# Patient Record
Sex: Female | Born: 1985 | Race: White | Hispanic: No | Marital: Married | Smoking: Current every day smoker
Health system: Southern US, Community
[De-identification: ages and names within clinical notes are randomized; demographics above are authoritative.]

## PROBLEM LIST (undated history)

## (undated) DIAGNOSIS — M792 Neuralgia and neuritis, unspecified: Secondary | ICD-10-CM

## (undated) DIAGNOSIS — E282 Polycystic ovarian syndrome: Secondary | ICD-10-CM

## (undated) DIAGNOSIS — R350 Frequency of micturition: Secondary | ICD-10-CM

## (undated) DIAGNOSIS — Z973 Presence of spectacles and contact lenses: Secondary | ICD-10-CM

## (undated) DIAGNOSIS — Z87442 Personal history of urinary calculi: Secondary | ICD-10-CM

## (undated) DIAGNOSIS — G4089 Other seizures: Secondary | ICD-10-CM

## (undated) DIAGNOSIS — Z8619 Personal history of other infectious and parasitic diseases: Secondary | ICD-10-CM

## (undated) DIAGNOSIS — E039 Hypothyroidism, unspecified: Secondary | ICD-10-CM

## (undated) DIAGNOSIS — Z87448 Personal history of other diseases of urinary system: Secondary | ICD-10-CM

## (undated) DIAGNOSIS — Z91410 Personal history of adult physical and sexual abuse: Secondary | ICD-10-CM

## (undated) DIAGNOSIS — R51 Headache: Secondary | ICD-10-CM

## (undated) DIAGNOSIS — R519 Headache, unspecified: Secondary | ICD-10-CM

## (undated) DIAGNOSIS — Z8781 Personal history of (healed) traumatic fracture: Secondary | ICD-10-CM

## (undated) DIAGNOSIS — R319 Hematuria, unspecified: Secondary | ICD-10-CM

## (undated) DIAGNOSIS — Z8742 Personal history of other diseases of the female genital tract: Secondary | ICD-10-CM

## (undated) DIAGNOSIS — F419 Anxiety disorder, unspecified: Secondary | ICD-10-CM

## (undated) DIAGNOSIS — R3915 Urgency of urination: Secondary | ICD-10-CM

## (undated) DIAGNOSIS — F329 Major depressive disorder, single episode, unspecified: Secondary | ICD-10-CM

## (undated) DIAGNOSIS — N200 Calculus of kidney: Secondary | ICD-10-CM

## (undated) DIAGNOSIS — N920 Excessive and frequent menstruation with regular cycle: Secondary | ICD-10-CM

## (undated) HISTORY — DX: Headache, unspecified: R51.9

## (undated) HISTORY — DX: Personal history of other infectious and parasitic diseases: Z86.19

## (undated) HISTORY — DX: Headache: R51

## (undated) HISTORY — PX: OTHER SURGICAL HISTORY: SHX169

## (undated) HISTORY — DX: Personal history of adult physical and sexual abuse: Z91.410

## (undated) HISTORY — PX: ANKLE HARDWARE REMOVAL: SHX1149

## (undated) HISTORY — PX: LAPAROSCOPIC APPENDECTOMY: SUR753

---
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
## 2007-04-11 ENCOUNTER — Ambulatory Visit (HOSPITAL_BASED_OUTPATIENT_CLINIC_OR_DEPARTMENT_OTHER): Admission: RE | Admit: 2007-04-11 | Discharge: 2007-04-11 | Payer: Self-pay | Admitting: Urology

## 2007-11-24 ENCOUNTER — Emergency Department (HOSPITAL_COMMUNITY): Admission: EM | Admit: 2007-11-24 | Discharge: 2007-11-24 | Payer: Self-pay | Admitting: Emergency Medicine

## 2008-04-25 ENCOUNTER — Inpatient Hospital Stay (HOSPITAL_COMMUNITY): Admission: AD | Admit: 2008-04-25 | Discharge: 2008-04-25 | Payer: Self-pay | Admitting: Obstetrics

## 2008-07-26 ENCOUNTER — Inpatient Hospital Stay (HOSPITAL_COMMUNITY): Admission: AD | Admit: 2008-07-26 | Discharge: 2008-07-31 | Payer: Self-pay | Admitting: Obstetrics

## 2008-07-28 ENCOUNTER — Encounter: Payer: Self-pay | Admitting: Obstetrics

## 2008-08-02 ENCOUNTER — Inpatient Hospital Stay (HOSPITAL_COMMUNITY): Admission: AD | Admit: 2008-08-02 | Discharge: 2008-08-02 | Payer: Self-pay | Admitting: Obstetrics & Gynecology

## 2008-09-05 ENCOUNTER — Emergency Department (HOSPITAL_BASED_OUTPATIENT_CLINIC_OR_DEPARTMENT_OTHER): Admission: EM | Admit: 2008-09-05 | Discharge: 2008-09-05 | Payer: Self-pay | Admitting: Emergency Medicine

## 2008-12-25 ENCOUNTER — Emergency Department (HOSPITAL_BASED_OUTPATIENT_CLINIC_OR_DEPARTMENT_OTHER): Admission: EM | Admit: 2008-12-25 | Discharge: 2008-12-25 | Payer: Self-pay | Admitting: Emergency Medicine

## 2009-02-16 ENCOUNTER — Ambulatory Visit: Payer: Self-pay | Admitting: Diagnostic Radiology

## 2009-02-16 ENCOUNTER — Emergency Department (HOSPITAL_BASED_OUTPATIENT_CLINIC_OR_DEPARTMENT_OTHER): Admission: EM | Admit: 2009-02-16 | Discharge: 2009-02-16 | Payer: Self-pay | Admitting: Emergency Medicine

## 2009-04-01 ENCOUNTER — Ambulatory Visit: Payer: Self-pay | Admitting: Diagnostic Radiology

## 2009-04-01 ENCOUNTER — Emergency Department (HOSPITAL_BASED_OUTPATIENT_CLINIC_OR_DEPARTMENT_OTHER): Admission: EM | Admit: 2009-04-01 | Discharge: 2009-04-01 | Payer: Self-pay | Admitting: Emergency Medicine

## 2009-08-26 ENCOUNTER — Emergency Department (HOSPITAL_BASED_OUTPATIENT_CLINIC_OR_DEPARTMENT_OTHER)
Admission: EM | Admit: 2009-08-26 | Discharge: 2009-08-26 | Payer: Self-pay | Source: Home / Self Care | Admitting: Emergency Medicine

## 2010-03-14 ENCOUNTER — Emergency Department (HOSPITAL_BASED_OUTPATIENT_CLINIC_OR_DEPARTMENT_OTHER)
Admission: EM | Admit: 2010-03-14 | Discharge: 2010-03-14 | Payer: Self-pay | Source: Home / Self Care | Admitting: Emergency Medicine

## 2010-03-26 HISTORY — PX: URETER SURGERY: SHX823

## 2010-05-02 ENCOUNTER — Emergency Department (HOSPITAL_BASED_OUTPATIENT_CLINIC_OR_DEPARTMENT_OTHER)

## 2010-05-02 ENCOUNTER — Emergency Department (HOSPITAL_BASED_OUTPATIENT_CLINIC_OR_DEPARTMENT_OTHER)
Admission: EM | Admit: 2010-05-02 | Discharge: 2010-05-03 | Disposition: A | Discharge status: Home / Self Care | Attending: Emergency Medicine | Admitting: Emergency Medicine

## 2010-05-02 ENCOUNTER — Emergency Department (INDEPENDENT_AMBULATORY_CARE_PROVIDER_SITE_OTHER)

## 2010-05-02 DIAGNOSIS — N739 Female pelvic inflammatory disease, unspecified: Secondary | ICD-10-CM | POA: Insufficient documentation

## 2010-05-02 DIAGNOSIS — K5900 Constipation, unspecified: Secondary | ICD-10-CM

## 2010-05-02 DIAGNOSIS — R110 Nausea: Secondary | ICD-10-CM

## 2010-05-02 DIAGNOSIS — N898 Other specified noninflammatory disorders of vagina: Secondary | ICD-10-CM | POA: Insufficient documentation

## 2010-05-02 DIAGNOSIS — R109 Unspecified abdominal pain: Secondary | ICD-10-CM | POA: Insufficient documentation

## 2010-05-02 DIAGNOSIS — R569 Unspecified convulsions: Secondary | ICD-10-CM | POA: Insufficient documentation

## 2010-05-02 DIAGNOSIS — R509 Fever, unspecified: Secondary | ICD-10-CM | POA: Insufficient documentation

## 2010-05-02 DIAGNOSIS — R1031 Right lower quadrant pain: Secondary | ICD-10-CM | POA: Insufficient documentation

## 2010-05-02 LAB — DIFFERENTIAL
Basophils Absolute: 0 10*3/uL (ref 0.0–0.1)
Eosinophils Absolute: 0.1 10*3/uL (ref 0.0–0.7)
Eosinophils Relative: 1 % (ref 0–5)
Lymphs Abs: 1.9 10*3/uL (ref 0.7–4.0)
Neutrophils Relative %: 54 % (ref 43–77)

## 2010-05-02 LAB — URINALYSIS, ROUTINE W REFLEX MICROSCOPIC
Bilirubin Urine: NEGATIVE
Leukocytes, UA: NEGATIVE
Protein, ur: NEGATIVE mg/dL
Urobilinogen, UA: 0.2 mg/dL (ref 0.0–1.0)
pH: 7.5 (ref 5.0–8.0)

## 2010-05-02 LAB — CBC
Hemoglobin: 12.4 g/dL (ref 12.0–15.0)
MCHC: 33.8 g/dL (ref 30.0–36.0)
RBC: 4.07 MIL/uL (ref 3.87–5.11)
RDW: 12.7 % (ref 11.5–15.5)
WBC: 5.5 10*3/uL (ref 4.0–10.5)

## 2010-05-02 LAB — PREGNANCY, URINE: Preg Test, Ur: NEGATIVE

## 2010-05-02 LAB — BASIC METABOLIC PANEL
BUN: 13 mg/dL (ref 6–23)
Calcium: 9.5 mg/dL (ref 8.4–10.5)
GFR calc non Af Amer: >60 mL/min (ref >60)
Potassium: 4 mEq/L (ref 3.5–5.1)

## 2010-05-02 LAB — URINE MICROSCOPIC-ADD ON

## 2010-05-02 MED ORDER — IOHEXOL 300 MG/ML  SOLN
100.0000 mL | Freq: Once | INTRAMUSCULAR | Status: AC | PRN
  Administered 2010-05-02: 100 mL via INTRAVENOUS

## 2010-05-03 LAB — GC/CHLAMYDIA PROBE AMP, GENITAL: Chlamydia, DNA Probe: NEGATIVE

## 2010-05-04 ENCOUNTER — Emergency Department (INDEPENDENT_AMBULATORY_CARE_PROVIDER_SITE_OTHER)

## 2010-05-04 ENCOUNTER — Emergency Department (HOSPITAL_BASED_OUTPATIENT_CLINIC_OR_DEPARTMENT_OTHER)
Admission: EM | Admit: 2010-05-04 | Discharge: 2010-05-04 | Disposition: A | Discharge status: Home / Self Care | Attending: Emergency Medicine | Admitting: Emergency Medicine

## 2010-05-04 DIAGNOSIS — R109 Unspecified abdominal pain: Secondary | ICD-10-CM

## 2010-05-04 DIAGNOSIS — N390 Urinary tract infection, site not specified: Secondary | ICD-10-CM | POA: Insufficient documentation

## 2010-05-04 DIAGNOSIS — M549 Dorsalgia, unspecified: Secondary | ICD-10-CM | POA: Insufficient documentation

## 2010-05-04 DIAGNOSIS — G8929 Other chronic pain: Secondary | ICD-10-CM | POA: Insufficient documentation

## 2010-05-04 DIAGNOSIS — N898 Other specified noninflammatory disorders of vagina: Secondary | ICD-10-CM

## 2010-05-04 LAB — URINALYSIS, ROUTINE W REFLEX MICROSCOPIC
Specific Gravity, Urine: 1.023 (ref 1.005–1.030)
Urobilinogen, UA: 0.2 mg/dL (ref 0.0–1.0)
pH: 6.5 (ref 5.0–8.0)

## 2010-05-04 LAB — URINE MICROSCOPIC-ADD ON

## 2010-06-05 LAB — CBC
Hemoglobin: 13.7 g/dL (ref 12.0–15.0)
MCH: 30.2 pg (ref 26.0–34.0)
MCV: 87.9 fL (ref 78.0–100.0)
RBC: 4.54 MIL/uL (ref 3.87–5.11)

## 2010-06-05 LAB — COMPREHENSIVE METABOLIC PANEL
ALT: 7 U/L (ref 0–35)
Calcium: 9.6 mg/dL (ref 8.4–10.5)
Chloride: 106 mEq/L (ref 96–112)
Creatinine, Ser: 0.7 mg/dL (ref 0.4–1.2)
Total Bilirubin: 0.8 mg/dL (ref 0.3–1.2)

## 2010-06-05 LAB — DIFFERENTIAL
Band Neutrophils: 0 % (ref 0–10)
Eosinophils Absolute: 0 10*3/uL (ref 0.0–0.7)
Eosinophils Relative: 2 % (ref 0–5)
Lymphs Abs: 0.9 10*3/uL (ref 0.7–4.0)
Metamyelocytes Relative: 0 %
Monocytes Absolute: 0.2 10*3/uL (ref 0.1–1.0)
Neutrophils Relative %: 51 % (ref 43–77)
Promyelocytes Absolute: 0 %

## 2010-06-05 LAB — URINALYSIS, ROUTINE W REFLEX MICROSCOPIC
Bilirubin Urine: NEGATIVE
Hgb urine dipstick: NEGATIVE
Protein, ur: NEGATIVE mg/dL
Specific Gravity, Urine: 1.028 (ref 1.005–1.030)
Urobilinogen, UA: 0.2 mg/dL (ref 0.0–1.0)

## 2010-06-05 LAB — PREGNANCY, URINE: Preg Test, Ur: NEGATIVE

## 2010-06-05 LAB — LIPASE, BLOOD: Lipase: 43 U/L (ref 23–300)

## 2010-06-11 LAB — DIFFERENTIAL
Basophils Absolute: 0.1 10*3/uL (ref 0.0–0.1)
Basophils Relative: 2 % — ABNORMAL HIGH (ref 0–1)
Eosinophils Absolute: 0.1 10*3/uL (ref 0.0–0.7)
Eosinophils Relative: 1 % (ref 0–5)
Monocytes Absolute: 0.3 10*3/uL (ref 0.1–1.0)
Neutro Abs: 3.5 10*3/uL (ref 1.7–7.7)

## 2010-06-11 LAB — URINE MICROSCOPIC-ADD ON

## 2010-06-11 LAB — URINALYSIS, ROUTINE W REFLEX MICROSCOPIC
Hgb urine dipstick: NEGATIVE
Ketones, ur: NEGATIVE mg/dL
Protein, ur: NEGATIVE mg/dL
Urobilinogen, UA: 0.2 mg/dL (ref 0.0–1.0)

## 2010-06-11 LAB — BASIC METABOLIC PANEL
CO2: 29 mEq/L (ref 19–32)
Calcium: 9.2 mg/dL (ref 8.4–10.5)
Glucose, Bld: 91 mg/dL (ref 70–99)
Sodium: 144 mEq/L (ref 135–145)

## 2010-06-11 LAB — CBC
Hemoglobin: 13.6 g/dL (ref 12.0–15.0)
MCHC: 33.5 g/dL (ref 30.0–36.0)
RDW: 12 % (ref 11.5–15.5)

## 2010-06-29 LAB — URINE CULTURE

## 2010-06-29 LAB — URINALYSIS, ROUTINE W REFLEX MICROSCOPIC
Nitrite: NEGATIVE
Specific Gravity, Urine: 1.024 (ref 1.005–1.030)
Urobilinogen, UA: 0.2 mg/dL (ref 0.0–1.0)

## 2010-06-29 LAB — URINE MICROSCOPIC-ADD ON

## 2010-06-29 LAB — PREGNANCY, URINE: Preg Test, Ur: NEGATIVE

## 2010-07-03 LAB — URINE MICROSCOPIC-ADD ON

## 2010-07-03 LAB — URINALYSIS, ROUTINE W REFLEX MICROSCOPIC
Ketones, ur: NEGATIVE mg/dL
Protein, ur: >300 mg/dL — AB
Urobilinogen, UA: 0.2 mg/dL (ref 0.0–1.0)

## 2010-07-03 LAB — WET PREP, GENITAL

## 2010-07-03 LAB — DIFFERENTIAL
Basophils Absolute: 0 10*3/uL (ref 0.0–0.1)
Eosinophils Relative: 1 % (ref 0–5)
Lymphocytes Relative: 20 % (ref 12–46)
Lymphs Abs: 1.8 10*3/uL (ref 0.7–4.0)
Monocytes Absolute: 0.7 10*3/uL (ref 0.1–1.0)
Monocytes Relative: 8 % (ref 3–12)
Neutro Abs: 6.6 10*3/uL (ref 1.7–7.7)

## 2010-07-03 LAB — RPR: RPR Ser Ql: NONREACTIVE

## 2010-07-03 LAB — URINE CULTURE

## 2010-07-03 LAB — CBC
HCT: 41.6 % (ref 36.0–46.0)
Hemoglobin: 14 g/dL (ref 12.0–15.0)
RBC: 4.4 MIL/uL (ref 3.87–5.11)
RDW: 13.1 % (ref 11.5–15.5)

## 2010-07-03 LAB — GC/CHLAMYDIA PROBE AMP, GENITAL
Chlamydia, DNA Probe: NEGATIVE
GC Probe Amp, Genital: NEGATIVE

## 2010-07-04 LAB — CBC
HCT: 27 % — ABNORMAL LOW (ref 36.0–46.0)
HCT: 34.3 % — ABNORMAL LOW (ref 36.0–46.0)
Hemoglobin: 11.7 g/dL — ABNORMAL LOW (ref 12.0–15.0)
Hemoglobin: 9.4 g/dL — ABNORMAL LOW (ref 12.0–15.0)
MCHC: 34.1 g/dL (ref 30.0–36.0)
MCV: 93.8 fL (ref 78.0–100.0)
MCV: 95.1 fL (ref 78.0–100.0)
Platelets: 114 10*3/uL — ABNORMAL LOW (ref 150–400)
RBC: 3.65 MIL/uL — ABNORMAL LOW (ref 3.87–5.11)
RDW: 14.7 % (ref 11.5–15.5)
WBC: 6.9 10*3/uL (ref 4.0–10.5)

## 2010-07-04 LAB — RPR: RPR Ser Ql: NONREACTIVE

## 2010-08-08 NOTE — Op Note (Signed)
Denise Griffin, Denise Griffin           ACCOUNT NO.:  [id number]   MEDICAL RECORD NO.:  [id number]          PATIENT TYPE:  INP   LOCATION:  9103                          FACILITY:  [facility]   PHYSICIAN:  Charles A. Clearance Coots, M.D.DATE OF BIRTH:  12/09/1985   DATE OF PROCEDURE:  07/28/2008  DATE OF DISCHARGE:                               OPERATIVE REPORT   PREOPERATIVE DIAGNOSIS:  Arrest of descent.   POSTOPERATIVE DIAGNOSIS:  Arrest of descent.   PROCEDURE:  Primary low transverse cesarean section.   SURGEON:  Charles A. Clearance Coots, MD   ASSISTANT:  Corey Skains, certified surgical technician.   ANESTHESIA:  Epidural.   ESTIMATED BLOOD LOSS:  800 mL.   IV FLUIDS:  1050 mL.   URINE OUTPUT:  600 mL.   COMPLICATIONS:  None.   Foley to gravity.   FINDINGS:  Viable female at 1415, Apgars of 9 at 1 minute and 9 at 5  minutes, weight of 7 pounds and 12 ounces.  Normal uterus, ovaries, and  fallopian tubes.   OPERATION:  The patient was brought to the operating room.  After  satisfactory redosing of the epidural, the abdomen was prepped and  draped in the usual sterile fashion.  A Pfannenstiel skin incision was  made with a scalpel that was deepened down to the fascia with a scalpel.  Fascia was nicked in the midline and the fascial incision was extended  to the left and to the right with curved Mayo scissors.  The superior  and inferior fascial edges were taken off of the rectus muscles, both  blunt and sharp dissection.  Rectus muscles bluntly and sharply divided  in the midline.  Peritoneum was entered digitally and was digitally  extended to the left and to the right.  The bladder blade was positioned  in the incision.  The vesicouterine fold of peritoneum above the  reflection of the urinary bladder was grasped with forceps and was  incised and undermined with Metzenbaum scissors.  The incision was  extended to the left and to the right with the Metzenbaum scissors.  The  bladder flap  was then bluntly developed and the bladder blade was  repositioned in front of the urinary bladder placing it well out of the  operative field.  The uterus was then entered transversely in the lower  uterine segment with the scalpel.  Clear amniotic fluid was expelled.  The uterine incision was then extended to the left and to the right  digitally.  The vertex was noted to be left occiput transverse.  The  occiput was rotated into the incision, but could not be fully extended  into the incision, and the mushroom Mityvac vacuum extractor was placed  on the occiput and the occiput was fully extended in the incision and  delivered with the aid of vacuum extraction and fundal pressure from the  assistant.  The delivery was accomplished with 1 pull of the vacuum.  The infant's mouth and nose were suctioned with a suction bulb and the  delivery was completed with the aid of fundal pressure from the  assistant.  The umbilical cord  was doubly clamped and cut, and the  infant was handed off to the nursery staff.  The placenta was then  spontaneously expelled from the uterine cavity intact.  The endometrial  surface was thoroughly debrided with a dry lap sponge.  The edges of the  uterine incision were grasped with ring forceps and the uterus was  closed with a continuous interlocking suture of 0 Monocryl from each  corner to the center.  Hemostasis was excellent.  The pelvic cavity was  then thoroughly irrigated with warm saline solution and all clots were  removed.  The abdomen was then closed as follows.  The parietal  peritoneum was closed with continuous suture of 2-0 Monocryl.  The  fascia was closed with continuous suture of 0 Vicryl.  Subcutaneous  tissue was thoroughly irrigated with warm saline solution and all areas  of subcutaneous bleeding were coagulated with Bovie.  Skin was then  closed with subcuticular continuous suture of 4-0 Monocryl.  Dermabond  was applied to the incision  closure.  Surgical technician indicated that  all needle, sponge and instrument counts were correct x2.  The patient  tolerated the procedure well, transported to the recovery room in  satisfactory condition.      Charles A. Clearance Coots, M.D.  Electronically Signed     CAH/MEDQ  D:  07/28/2008  T:  07/29/2008  Job:  [id number]

## 2010-08-08 NOTE — Op Note (Signed)
[name], Denise Griffin              ACCOUNT NO.:  [id number]   MEDICAL RECORD NO.:  [id number]          PATIENT TYPE:  AMB   LOCATION:  NESC                         FACILITY:  [facility]   PHYSICIAN:  Sigmund I. Patsi Sears, M.D.DATE OF BIRTH:  October 22, 1985   DATE OF PROCEDURE:  04/11/2007  DATE OF DISCHARGE:                               OPERATIVE REPORT   PREOPERATIVE DIAGNOSES:  Recurrent urinary tract infections.   POSTOPERATIVE DIAGNOSES:  Recurrent urinary tract infections.   OPERATION:  Cystourethroscopy and cystogram.   SURGEON:  Sigmund I. Patsi Sears, M.D.   ANESTHESIA:  General LMA.   PRE-OPERATION:  After appropriate pre-anesthesia, the patient was  brought to the operating room, placed on the operating table in dorsal  supine position, where general LMA anesthesia was induced.  She was then  replaced in the dorsal lithotomy position, where the pubis was prepped  with Betadine solution and draped in the usual fashion.   HISTORY:  This 25 year old female was [facility], and Dr. Bennie Hind at [facility], for recurrent  urinary tract infections and kidney infections since she was a small  girl.  She has never had evaluation.  The patient has had as many as 15  bladder infections in the last two years, but does not believe these are  sexually-associated.  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  No fever, no chills, no back  pain.   EXAMINATION:  Cystourethroscopy was accomplished.  It shows that the  patient has a normal-appearing urethra, normal external female  genitalia.  Vaginal examination shows normal estrogenization.  The  internal organs are normal.  The cervix is normal.  There is no  enterocele, rectocele or cystocele.   Cystoscopy shows a normal-appearing trigone, with a normal right  ureteral  orifice.  However, her left UO is gaping open and is photo-  documented.  Cystogram is accomplished, but there is no reflux noted.   The bladder is drained of fluid.   The patient is awakened after being given a B and O suppository, and  Toradol, and she is taken to the recovery room in good condition.      Sigmund I. Patsi Sears, M.D.  Electronically Signed    SIT/MEDQ  D:  04/11/2007  T:  04/11/2007  Job:  [id number]

## 2010-08-08 NOTE — Op Note (Signed)
Denise Griffin, Denise Griffin              ACCOUNT NO.:  [id number]   MEDICAL RECORD NO.:  [id number]          PATIENT TYPE:  AMB   LOCATION:  DSC                          FACILITY:  [facility]   PHYSICIAN:  Harvie Junior, M.D.   DATE OF BIRTH:  12/21/1985   DATE OF PROCEDURE:  11/22/2006  DATE OF DISCHARGE:                               OPERATIVE REPORT   PREOPERATIVE DIAGNOSIS:  Retained hardware medially status post medial  malleolar fracture.   POSTOPERATIVE DIAGNOSIS:  Retained hardware medially status post medial  malleolar fracture.   PRINCIPAL PROCEDURES:  1. Excision of deep medial hardware.  2. Debridement of bone from the tibia to allow for extraction of the      opening and debridement of bone cortex of the tibia to allow for      extraction of the second medial hardware.   SURGEON:  Harvie Junior, M.D.   ASSISTANT:  Marshia Ly, P.A.   ANESTHESIA:  General.   BRIEF HISTORY:  Denise Griffin is a 25 year old female with a long history of  having had medial malleolar fractures treated with 2 medial malleolar  screws.  Excellent fixation was achieved.  Excellent healing was  achieved.  She had done great.  She was preparing to go into [employer] and they felt that she needed to have her hardware removed  prior to going in and so she was brought to the operating room for this  procedure.  She was also having some complaints of medial side pain and  prominence of the hardware and that was involved in the decision as  well.   PROCEDURE:  The patient was taken to the operating room and after  adequate anesthesia was obtained with general anesthetic, the patient  was placed up on the operating table.  The leg was then prepped and  draped in the usual sterile fashion, this will be the right leg.  Following this, blood pressure tourniquet was inflated to 250 mmHg, this  was a calf tourniquet.   At this point, attention was turned to the medial side where her old  incision was  opened.  Once this was completed, we had done some local  anesthetics.  We palpated through the deltoid ligament, found the area  of the head and the screwdriver was used to place on the head.  There  was a tremendous amount of tension on the screw head at this point.  We  were concerned about screw breakage.  Ultimately we were able to advance  the screw a little bit and then back it out, advance it and back it out  and, ultimately, when this was done, we were able to advance the screw  out.   Attention was then turned posterior to this and we were able to find the  second screw head and got a screwdriver into that, seated well and began  to advance forward and backwards and, unfortunately, the screw fatigued  and failed just about 3 mm from the screw head.  At this point, the  screw head came out in the wound.  At  that point, decisions had to be  made, do we advance, do we not.  Given the need for removal of hardware  for continuance in career, we felt that removal would be appropriate and  so we wanted to go about this in a reasonable fashion.  If we felt like  we had to do too much, we certainly would not do that.   At this point, we got trephines out from a screw removal set and did  basically an opening of the bone cortex medially and removal of a  significant amount of bone medially with a 25 trephine going over the 25  screw and this we advanced under fluoroscopic imaging all the way up to  the screw portion of this.   At this point, we got the 45 screw out and advanced it up through this  similar track and then under direct vision, advanced it over the screw.  Once we were able to advance it to the distal end of the screw, the  screw came out in total.   At that point, the wound was copiously and thoroughly irrigated.  We had  taken some of the bone that we had gotten from our trephines and we had  put that on the back table.  This was then bone grafted into this hole  and  then after the wound was irrigated, the deltoid ligament was  repaired with 3-0 Vicryl interrupted and then the skin with 4-0 nylon  stitch.   At this point, a sterile compression dressing was applied.  The patient  was taken to the recovery, and was noted to be in satisfactory  condition.   ESTIMATED BLOOD LOSS OF PROCEDURE:  None.      Harvie Junior, M.D.  Electronically Signed     JLG/MEDQ  D:  11/22/2006  T:  11/22/2006  Job:  [id number]

## 2010-08-08 NOTE — H&P (Signed)
Denise Griffin, Denise Griffin           ACCOUNT NO.:  [id number]   MEDICAL RECORD NO.:  [id number]          PATIENT TYPE:  INP   LOCATION:  9169                          FACILITY:  [facility]   PHYSICIAN:  Roseanna Rainbow, M.D.DATE OF BIRTH:  [birth year]   DATE OF ADMISSION:  07/26/2008  DATE OF DISCHARGE:                              HISTORY & PHYSICAL   CHIEF COMPLAINT:  The patient is a 25 year old para 0 with an estimated  date of confinement of April 26 with an intrauterine pregnancy at 41  weeks for induction of labor secondary to postdates.   HISTORY OF PRESENT ILLNESS:  Please see the above.   ALLERGIES:  No known drug allergies.   MEDICATIONS:  Prenatal vitamins.   OB RISK FACTORS:  GBS positive.   PRENATAL LABS:  Blood type O positive and antibody screen negative.  Chlamydia probe negative.  Urine culture and sensitivity, insignificant  growth.  Pap smear negative.  GC probe negative.  One-hour GCT 101.  GBS  positive on March 25.  Hepatitis B surface antigen negative.  Hematocrit  32.7, hemoglobin 10.8, HIV nonreactive, platelets 171,000, RPR  nonreactive, rubella immune, and varicella immune.   PAST GYN HISTORY:  A laparoscopic procedure.   PAST MEDICAL HISTORY:  Asthma and seasonal allergies.   PAST SURGICAL HISTORY:  Please see the above and ankle surgery.   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  She is  married, living with her spouse.  Does not give any significant history  of alcohol usage.  Previously smoked 1/2 pack per day, stopped recently.  Denies illicit drug use.   FAMILY HISTORY:  No major illnesses known.   REVIEW OF SYSTEMS:  Noncontributory.   PHYSICAL EXAMINATION:  VITAL SIGNS:  Stable, afebrile.  CARDIAC:  Fetal heart tracing reassuring.  GU:  Tocodynamometer rare uterine contractions.  Sterile vaginal exam  per the RN.  Cervix is closed.  GENERAL:  In no apparent distress.  ABDOMEN:  Gravid.   ASSESSMENT:  Primigravida  at 41 weeks for induction of labor.  Unfavorable Bishop score.  Fetal heart tracing consistent with fetal  well being.  GBS positive.   PLAN:  Admission, two-stage induction of labor.  Penicillin, GBS  prophylaxis per protocol on labor.      Roseanna Rainbow, M.D.  Electronically Signed     LAJ/MEDQ  D:  07/27/2008  T:  07/27/2008  Job:  [id number]

## 2010-08-11 NOTE — Discharge Summary (Signed)
Denise Griffin, Denise Griffin           ACCOUNT NO.:  [id number]   MEDICAL RECORD NO.:  [id number]          PATIENT TYPE:  INP   LOCATION:  9103                          [facility]ILITY[facility]   PHYSICIAN:  Charles A. Clearance Coots, M.D.DATE OF BIRTH:  03-Sep-1985   DATE OF ADMISSION:  07/26/2008  DATE OF DISCHARGE:  07/31/2008                               DISCHARGE SUMMARY   ADMITTING DIAGNOSES:  Postdates pregnancy at 29 weeks' gestation,  induction of labor, unfavorable Bishop score.   DISCHARGE DIAGNOSES:  Postdates pregnancy at 7 weeks' gestation,  induction of labor, unfavorable Bishop score, status post primary low  transverse cesarean section on Jul 28, 2008, for arrest of descent.  Viable female was delivered at 14:15, Apgars of 9 at 1 minute and 9 at 5  minutes, weight of 3510 g, length of 52.71 cm.  Mother and infant  discharged home in good condition.   REASON FOR ADMISSION:  A 25 year old para 0 with estimated date of  confinement of July 19, 2008, presents for induction of labor,  postdates.   PAST MEDICAL HISTORY:   SURGERY:  Ankle surgery.  Surgical laparoscopy.   ILLNESSES:  Asthma, seasonal allergies.   MEDICATIONS:  Prenatal vitamins.   ALLERGIES:  SEASONAL.   SOCIAL HISTORY:  Married, living with spouse.  Smokes half a pack of  cigarettes per day 5-10 years.  Negative alcohol or recreational drug  use.   FAMILY HISTORY:  No major illnesses known.   REVIEW OF SYSTEMS:  Noncontributory.   PHYSICAL EXAMINATION:  GENERAL:  A well-nourished, well-developed female  in no acute distress, afebrile.  VITAL SIGNS:  Stable.  HEENT:  Within normal limits.  LUNGS:  Clear to auscultation bilaterally.  HEART:  Regular rate and rhythm.  ABDOMEN:  Gravid, nontender.  PELVIC:  Cervix; long, closed, vertex at a -3 station.   ADMITTING LABORATORY FINDINGS:  Hemoglobin 11, hematocrit 34, white  blood cell count 6900, platelets 133,000, RPR was nonreactive.   HOSPITAL COURSE:  The  patient was admitted and two-stage induction of  labor started with cervical ripening, followed by Pitocin.  The patient  did not make any significant progress and second serial day of induction  was started with Pitocin and the patient progressed to full dilatation  on Jul 28, 2008, at 09:30 with the vertex at a +1 station, but made no  further descent of the vertex for greater than 3 hours with adequate  labor.  The decision was made to proceed with cesarean section for  arrest of descent.  Primary low transverse cesarean section was  performed on Jul 28, 2008.  There were no intraoperative complications.  Postoperative course was uncomplicated.  The patient was discharged home  on postop day #3 in good condition.   DISCHARGE LABORATORY FINDINGS:  Hemoglobin 9, hematocrit 27, white blood  cell count 8400, platelets 114,000.   DISCHARGE DISPOSITION:   MEDICATIONS:  Continue prenatal vitamins, ibuprofen and Percocet were  prescribed for pain.   Routine written instructions were given for discharge after cesarean  section.  The patient is to call office for a followup appointment in 2  weeks.      Bing Neighbors. Clearance Coots, M.D.  Electronically Signed     CAH/MEDQ  D:  09/29/2008  T:  09/30/2008  Job:  [id number]

## 2010-10-14 ENCOUNTER — Ambulatory Visit (HOSPITAL_COMMUNITY)
Admission: AD | Admit: 2010-10-14 | Discharge: 2010-10-16 | Disposition: A | Discharge status: Home / Self Care | Source: Other Acute Inpatient Hospital | Attending: General Surgery | Admitting: General Surgery

## 2010-10-14 ENCOUNTER — Emergency Department (INDEPENDENT_AMBULATORY_CARE_PROVIDER_SITE_OTHER)

## 2010-10-14 ENCOUNTER — Emergency Department (HOSPITAL_BASED_OUTPATIENT_CLINIC_OR_DEPARTMENT_OTHER)
Admission: EM | Admit: 2010-10-14 | Discharge: 2010-10-14 | Disposition: A | Discharge status: Nursing Home (Includes ICF) | Source: Home / Self Care

## 2010-10-14 ENCOUNTER — Encounter: Payer: Self-pay | Admitting: Emergency Medicine

## 2010-10-14 ENCOUNTER — Other Ambulatory Visit (INDEPENDENT_AMBULATORY_CARE_PROVIDER_SITE_OTHER): Payer: Self-pay | Admitting: General Surgery

## 2010-10-14 DIAGNOSIS — R1031 Right lower quadrant pain: Secondary | ICD-10-CM

## 2010-10-14 DIAGNOSIS — R112 Nausea with vomiting, unspecified: Secondary | ICD-10-CM

## 2010-10-14 DIAGNOSIS — K37 Unspecified appendicitis: Secondary | ICD-10-CM

## 2010-10-14 DIAGNOSIS — K352 Acute appendicitis with generalized peritonitis, without abscess: Secondary | ICD-10-CM

## 2010-10-14 DIAGNOSIS — Z01812 Encounter for preprocedural laboratory examination: Secondary | ICD-10-CM | POA: Insufficient documentation

## 2010-10-14 DIAGNOSIS — K3580 Unspecified acute appendicitis: Secondary | ICD-10-CM | POA: Insufficient documentation

## 2010-10-14 DIAGNOSIS — B9689 Other specified bacterial agents as the cause of diseases classified elsewhere: Secondary | ICD-10-CM

## 2010-10-14 LAB — DIFFERENTIAL
Basophils Relative: 0 % (ref 0–1)
Eosinophils Absolute: 0.1 10*3/uL (ref 0.0–0.7)
Monocytes Relative: 7 % (ref 3–12)
Neutrophils Relative %: 84 % — ABNORMAL HIGH (ref 43–77)

## 2010-10-14 LAB — WET PREP, GENITAL: Yeast Wet Prep HPF POC: NONE SEEN

## 2010-10-14 LAB — URINALYSIS, ROUTINE W REFLEX MICROSCOPIC
Bilirubin Urine: NEGATIVE
Ketones, ur: NEGATIVE mg/dL
Nitrite: NEGATIVE
Protein, ur: NEGATIVE mg/dL
Urobilinogen, UA: 0.2 mg/dL (ref 0.0–1.0)

## 2010-10-14 LAB — COMPREHENSIVE METABOLIC PANEL
ALT: 9 U/L (ref 0–35)
Calcium: 9.9 mg/dL (ref 8.4–10.5)
GFR calc Af Amer: >60 mL/min (ref >60)
Glucose, Bld: 106 mg/dL — ABNORMAL HIGH (ref 70–99)
Sodium: 135 mEq/L (ref 135–145)
Total Protein: 8.3 g/dL (ref 6.0–8.3)

## 2010-10-14 LAB — CBC
Hemoglobin: 13.5 g/dL (ref 12.0–15.0)
MCH: 31.7 pg (ref 26.0–34.0)
MCHC: 35.2 g/dL (ref 30.0–36.0)
Platelets: ADEQUATE 10*3/uL (ref 150–400)

## 2010-10-14 LAB — LIPASE, BLOOD: Lipase: 22 U/L (ref 11–59)

## 2010-10-14 MED ORDER — SODIUM CHLORIDE 0.9 % IV SOLN
INTRAVENOUS | Status: DC
  Administered 2010-10-14: 09:00:00 via INTRAVENOUS

## 2010-10-14 MED ORDER — HYDROMORPHONE HCL 1 MG/ML IJ SOLN
1.0000 mg | Freq: Once | INTRAMUSCULAR | Status: AC
  Administered 2010-10-14: 1 mg via INTRAVENOUS
  Filled 2010-10-14: qty 1

## 2010-10-14 MED ORDER — IOHEXOL 300 MG/ML  SOLN: 100.0000 mL | Freq: Once | INTRAMUSCULAR | Status: DC | PRN

## 2010-10-14 MED ORDER — ONDANSETRON HCL 4 MG/2ML IJ SOLN
4.0000 mg | Freq: Once | INTRAMUSCULAR | Status: AC
  Administered 2010-10-14: 4 mg via INTRAVENOUS
  Filled 2010-10-14: qty 2

## 2010-10-14 NOTE — ED Notes (Signed)
Patient is resting comfortably. 

## 2010-10-14 NOTE — ED Notes (Signed)
MD at bedside. 

## 2010-10-14 NOTE — ED Provider Notes (Signed)
History     Chief Complaint  Patient presents with  . Abdominal Pain   HPI Comments: Pt woke up around 3 A.M. With mid abdominal pain.  She had one episode of vomiting.  She says it hurts to breath.  She has had some lower abdominal pain, and she is supposed to have laparoscopy in August 2012 to see if she has adhesions from a prior C-section.  She has a vaginal discharge.  She had a period that lasted for two and one-half months, ending 2 weeks ago.  Patient is a 25 y.o. female presenting with abdominal pain. The history is provided by the patient. No language interpreter was used.  Abdominal Pain The primary symptoms of the illness include abdominal pain, shortness of breath, vomiting and vaginal discharge. The primary symptoms of the illness do not include fever or diarrhea. The current episode started 3 to 5 hours ago. The onset of the illness was sudden. The problem has not changed since onset. The patient has not had a change in bowel habit.    Past Medical History  Diagnosis Date  . Seizures     Past Surgical History  Procedure Date  . Cesarean section     No family history on file.  History  Substance Use Topics  . Smoking status: Not on file  . Smokeless tobacco: Not on file  . Alcohol Use: No    OB History    Grav Para Term Preterm Abortions TAB SAB Ect Mult Living                  Review of Systems  Constitutional: Negative.  Negative for fever.  HENT: Negative.   Eyes: Negative.   Respiratory: Positive for shortness of breath.   Cardiovascular: Negative.   Gastrointestinal: Positive for vomiting and abdominal pain. Negative for diarrhea.  Genitourinary: Positive for vaginal discharge.  Musculoskeletal: Negative.   Neurological: Negative.   Psychiatric/Behavioral: Negative.     Physical Exam  BP 106/83  Pulse 112  Temp(Src) 98.2 F (36.8 C) (Oral)  Resp 24  Ht 5\' 5"  (1.651 m)  Wt 146 lb (66.225 kg)  BMI 24.30 kg/m2  SpO2 100%  Physical Exam    Constitutional: She is oriented to person, place, and time. She appears well-developed and well-nourished. She appears distressed.  HENT:  Head: Normocephalic and atraumatic.  Right Ear: External ear normal.  Left Ear: External ear normal.  Eyes: Conjunctivae and EOM are normal. Pupils are equal, round, and reactive to light. No scleral icterus.  Neck: Normal range of motion. Neck supple.  Cardiovascular: Normal rate and regular rhythm.   Pulmonary/Chest: Effort normal and breath sounds normal.  Abdominal: Soft. She exhibits mass. There is tenderness (She has mid-abdominal tenderness without rebound or rigidity.).  Musculoskeletal: Normal range of motion.  Neurological: She is alert and oriented to person, place, and time.       Sensory and motor exams intact.  Skin: Skin is warm and dry.  Psychiatric: She has a normal mood and affect. Her behavior is normal.    ED Course  Procedures  MDM Pt seen --> physical examination performed.  IV fluids, IV medications for pain and nausea ordered.  Lab workup and abdominal CT ordered.    11:15 A.M.  CBC shows WBC 10,300 with 84% neutrophils. CMET essentially normal.  Wet prep shows many clue cells --> bacterial vaginosis.  UA is negative and UPG is negative.  CT of the abdomen and pelvis shows acute appendicitis.  Call to [facility] --> 11:42 A.M.  Pt accepted for transfer to [facility] to 5100, Dr. Donell Beers.   Carleene Cooper III, MD 10/14/10 [phone]

## 2010-10-14 NOTE — ED Notes (Signed)
Family at bedside. 

## 2010-10-14 NOTE — ED Notes (Signed)
Patient denies pain and is resting comfortably.  

## 2010-10-14 NOTE — ED Notes (Signed)
Pt guarding lower abd reports vomiting and hx of adhesions

## 2010-10-15 LAB — BASIC METABOLIC PANEL
CO2: 29 mEq/L (ref 19–32)
CO2: 28 mEq/L (ref 19–32)
Calcium: 8.9 mg/dL (ref 8.4–10.5)
Chloride: 103 mEq/L (ref 96–112)
Chloride: 104 mEq/L (ref 96–112)
Creatinine, Ser: 0.65 mg/dL (ref 0.50–1.10)
Glucose, Bld: 75 mg/dL (ref 70–99)
Glucose, Bld: 119 mg/dL — ABNORMAL HIGH (ref 70–99)
Potassium: 4.1 mEq/L (ref 3.5–5.1)
Sodium: 137 mEq/L (ref 135–145)

## 2010-10-15 LAB — CBC
HCT: 33 % — ABNORMAL LOW (ref 36.0–46.0)
Hemoglobin: 11.2 g/dL — ABNORMAL LOW (ref 12.0–15.0)
MCV: 93.5 fL (ref 78.0–100.0)
Platelets: 133 10*3/uL — ABNORMAL LOW (ref 150–400)
Platelets: 146 10*3/uL — ABNORMAL LOW (ref 150–400)
RBC: 3.53 MIL/uL — ABNORMAL LOW (ref 3.87–5.11)
WBC: 5.3 10*3/uL (ref 4.0–10.5)

## 2010-10-16 ENCOUNTER — Telehealth (INDEPENDENT_AMBULATORY_CARE_PROVIDER_SITE_OTHER): Payer: Self-pay

## 2010-10-16 LAB — GC/CHLAMYDIA PROBE AMP, GENITAL
Chlamydia, DNA Probe: NEGATIVE
GC Probe Amp, Genital: NEGATIVE

## 2010-10-16 NOTE — Telephone Encounter (Signed)
May call in phenergan 12.5 mg po q6 hours prn n/v

## 2010-10-16 NOTE — Telephone Encounter (Signed)
Pt called requesting something different than oxycodone.  Causing nausea and is too strong. Pt states she has med at home for nausea. Per protocol hydrocodone 5/325 #30 1 po q 4-6h prn called to [facility] [phone] MD line. Pt to call if nausea does not improve or if pain is not managed with hydrocodone.

## 2010-10-23 ENCOUNTER — Emergency Department (HOSPITAL_COMMUNITY)

## 2010-10-23 ENCOUNTER — Telehealth (INDEPENDENT_AMBULATORY_CARE_PROVIDER_SITE_OTHER): Payer: Self-pay

## 2010-10-23 ENCOUNTER — Emergency Department (HOSPITAL_COMMUNITY)
Admission: EM | Admit: 2010-10-23 | Discharge: 2010-10-24 | Disposition: A | Discharge status: Home / Self Care | Attending: Emergency Medicine | Admitting: Emergency Medicine

## 2010-10-23 DIAGNOSIS — F3289 Other specified depressive episodes: Secondary | ICD-10-CM | POA: Insufficient documentation

## 2010-10-23 DIAGNOSIS — G40909 Epilepsy, unspecified, not intractable, without status epilepticus: Secondary | ICD-10-CM | POA: Insufficient documentation

## 2010-10-23 DIAGNOSIS — F329 Major depressive disorder, single episode, unspecified: Secondary | ICD-10-CM | POA: Insufficient documentation

## 2010-10-23 DIAGNOSIS — N898 Other specified noninflammatory disorders of vagina: Secondary | ICD-10-CM | POA: Insufficient documentation

## 2010-10-23 DIAGNOSIS — Z79899 Other long term (current) drug therapy: Secondary | ICD-10-CM | POA: Insufficient documentation

## 2010-10-23 DIAGNOSIS — R1031 Right lower quadrant pain: Secondary | ICD-10-CM | POA: Insufficient documentation

## 2010-10-23 DIAGNOSIS — R10813 Right lower quadrant abdominal tenderness: Secondary | ICD-10-CM | POA: Insufficient documentation

## 2010-10-23 DIAGNOSIS — R109 Unspecified abdominal pain: Secondary | ICD-10-CM | POA: Insufficient documentation

## 2010-10-23 DIAGNOSIS — K5900 Constipation, unspecified: Secondary | ICD-10-CM | POA: Insufficient documentation

## 2010-10-23 LAB — CBC
Hemoglobin: 11.6 g/dL — ABNORMAL LOW (ref 12.0–15.0)
MCH: 31.3 pg (ref 26.0–34.0)
MCV: 92.5 fL (ref 78.0–100.0)
Platelets: 214 10*3/uL (ref 150–400)
RBC: 3.71 MIL/uL — ABNORMAL LOW (ref 3.87–5.11)

## 2010-10-23 LAB — URINALYSIS, ROUTINE W REFLEX MICROSCOPIC
Bilirubin Urine: NEGATIVE
Glucose, UA: NEGATIVE mg/dL
Ketones, ur: NEGATIVE mg/dL
Specific Gravity, Urine: 1.018 (ref 1.005–1.030)
pH: 6.5 (ref 5.0–8.0)

## 2010-10-23 LAB — POCT I-STAT, CHEM 8
BUN: 8 mg/dL (ref 6–23)
Calcium, Ion: 1.15 mmol/L (ref 1.12–1.32)
Chloride: 106 mEq/L (ref 96–112)
Glucose, Bld: 99 mg/dL (ref 70–99)

## 2010-10-23 LAB — DIFFERENTIAL
Eosinophils Absolute: 0.1 10*3/uL (ref 0.0–0.7)
Lymphs Abs: 1.8 10*3/uL (ref 0.7–4.0)
Monocytes Absolute: 0.4 10*3/uL (ref 0.1–1.0)
Monocytes Relative: 8 % (ref 3–12)
Neutrophils Relative %: 49 % (ref 43–77)

## 2010-10-23 LAB — URINE MICROSCOPIC-ADD ON

## 2010-10-23 MED ORDER — IOHEXOL 300 MG/ML  SOLN
100.0000 mL | Freq: Once | INTRAMUSCULAR | Status: AC | PRN
  Administered 2010-10-23: 100 mL via INTRAVENOUS

## 2010-10-23 NOTE — Telephone Encounter (Signed)
Pt called in complaining of low Bp since she was in the hospital.  She went to her medical md on Friday and he sent her for fluids.  She got a 2hour fluid infusion.  Her Bp was still low Friday night 87/50 and she felt dizzy and passed out.  She has been nauseated and clammy.  Her pain is worse today on her rt side and she feels a lump there.  She called the on call md yesterday and he advised liquid diet.  She did that and has taken a laxative.  She had a small bm yesterday that was loose, otherwise her last bm was 7-19.  I spoke to Dr Biagio Quint in Dr Arita Miss  absence and he advised she should go to [facility] and get checked because she may need a ct and labs.  I notified the pt.

## 2010-10-24 LAB — WET PREP, GENITAL
Trich, Wet Prep: NONE SEEN
Yeast Wet Prep HPF POC: NONE SEEN

## 2010-10-24 LAB — GC/CHLAMYDIA PROBE AMP, GENITAL: Chlamydia, DNA Probe: NEGATIVE

## 2010-10-24 NOTE — Op Note (Signed)
[name], Denise Griffin           ACCOUNT NO.:  [id number]  MEDICAL RECORD NO.:  [id number]  LOCATION:  5151                         FACILITY:  [facility]  PHYSICIAN:  Almond Lint, MD       DATE OF BIRTH:  November 24, 1985  DATE OF PROCEDURE:  10/14/2010 DATE OF DISCHARGE:                              OPERATIVE REPORT   PREOPERATIVE DIAGNOSIS:  Acute appendicitis and chronic pelvic pain   POSTOPERATIVE DIAGNOSIS:  Acute appendicitis and chronic pelvic pain  PROCEDURE:  Laparoscopic appendectomy.  SURGEON:  Almond Lint, MD  ANESTHESIA:  General and local.  FINDINGS:  Acute supporative appendicitis and significant constipation,  SPECIMEN:  Appendix to Pathology.  ESTIMATED BLOOD LOSS:  Minimal.  COMPLICATIONS:  None known.  PROCEDURE IN DETAIL:  [name] was identified in the holding area and taken to the operating room where she was placed supine on the operating room table.  General anesthesia was induced.  Abdomen was prepped and draped in sterile fashion after Foley catheter was placed. Time-out was performed according to surgical safety check list.  When all was correct, we continued.  The infraumbilical skin at the site of her prior incision was anesthetized with local anesthetic.  An 1.5-cm incision was made with a #11 blade.  The Subcutaneous tissues were spread with Tresa Endo.  Two Kochers were used to elevate the midline fascia. A Kelly clamp was used to confirm entrance into the peritoneal cavity. A 0 Vicryl suture was placed around the fascial incision.  The Hasson was introduced in the abdomen and held in place to the abdominal wall with the tails of suture.  Pneumoperitoneum was achieved to a pressure of 15 mmHg.  The patient was then placed into Trendelenburg position and rotated to the left.  Two 5-mm trocars were placed into the left lower quadrant and the suprapubic region in her C-section scar.  The appendix was seen in the retrocecal location.  The tip was  elevated and the attachments were taken down with the harmonic scalpel.  The appendiceal artery was taken down with the harmonic scalpel as well.  Once the base of the appendix was completely skeletonized, this was stapled with the Endo-GIA bowel load and placed into an EndoCatch bag and retrieved through the umbilical incision.  Pneumoperitoneum was reachieved.  Because of her pelvic pain, her pelvic organs were examined.  Her left and right ovary appeared normal.  The uterus also appeared normal.  There was no sign of implant on treatment of endometriosis.  She did, however, have constipation and hard stool going all the way up into her sigmoid colon. The cecum was reexamined, and there was no sign of bleeding at the appendiceal stump.  The pelvis and right lower quadrant were irrigated, and the irrigant suctioned out.  The four quadrant inspection was performed demonstrating no other gross abnormalities.  The suprapubic and the left lower quadrant trocars were removed with no evidence of bleeding from the abdominal wall.  The pneumoperitoneum was allowed to evacuate through the umbilical Hasson.  It was removed and then the pursestring suture was tied down.  There was no residual palpable fascial defect present.  The skin was reapproximated with 4-0 Monocryl in running  subcuticular fashion.  The wounds were cleaned, dried, and dressed with Dermabond.  The patient was awakened from anesthesia and taken to PACU in stable condition.     Almond Lint, MD     FB/MEDQ  D:  10/14/2010  T:  10/15/2010  Job:  [id number]  Electronically Signed by Almond Lint MD on 10/24/2010 02:24:46 PM

## 2010-10-30 ENCOUNTER — Telehealth (INDEPENDENT_AMBULATORY_CARE_PROVIDER_SITE_OTHER): Payer: Self-pay

## 2010-10-30 ENCOUNTER — Other Ambulatory Visit (INDEPENDENT_AMBULATORY_CARE_PROVIDER_SITE_OTHER): Payer: Self-pay

## 2010-10-30 ENCOUNTER — Other Ambulatory Visit (INDEPENDENT_AMBULATORY_CARE_PROVIDER_SITE_OTHER): Payer: Self-pay | Admitting: General Surgery

## 2010-10-30 DIAGNOSIS — G8918 Other acute postprocedural pain: Secondary | ICD-10-CM

## 2010-10-30 NOTE — Telephone Encounter (Signed)
Pt's boyfriend called today to report she is having increasing pain and black, tarry stools.  Pt went to [facility] ED last Monday.  CT scan and labs were normal.  Dr. Donell Beers ordered CBC w/ diff, CMET, and guiac stool card.  Pt is aware and will go for labs at [facility] today.
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
## 2010-10-30 NOTE — Telephone Encounter (Signed)
Patient's boyfriend called to see what else she should do for the pain since she got her labs drawn.  I got the pt on the phone and she stated the hydrocodone is not helping since she had an excrutiating bm.  I spoke to Dr Donell Beers and she advised use BID stool softeners, daily laxative such as senna or dulcolax, and daily miralax.  I informed the pt and she has been doing this along with TID Miralax.  She is drinking plenty of liquids.  Dr Donell Beers advised try an enema and they will call her with the lab results.  I informed the pt and told her to keep her appt for tomorrow due to her pain

## 2010-10-31 ENCOUNTER — Encounter (INDEPENDENT_AMBULATORY_CARE_PROVIDER_SITE_OTHER): Payer: Self-pay | Admitting: General Surgery

## 2010-10-31 ENCOUNTER — Ambulatory Visit (INDEPENDENT_AMBULATORY_CARE_PROVIDER_SITE_OTHER): Admitting: General Surgery

## 2010-10-31 DIAGNOSIS — K5900 Constipation, unspecified: Secondary | ICD-10-CM

## 2010-10-31 DIAGNOSIS — Z9889 Other specified postprocedural states: Secondary | ICD-10-CM

## 2010-10-31 DIAGNOSIS — K5909 Other constipation: Secondary | ICD-10-CM

## 2010-10-31 DIAGNOSIS — G8918 Other acute postprocedural pain: Secondary | ICD-10-CM

## 2010-10-31 DIAGNOSIS — K3580 Unspecified acute appendicitis: Secondary | ICD-10-CM

## 2010-10-31 LAB — CBC WITH DIFFERENTIAL/PLATELET
Basophils Absolute: 0.1 10*3/uL (ref 0.0–0.1)
Basophils Relative: 1 % (ref 0–1)
HCT: 41.8 % (ref 36.0–46.0)
Lymphocytes Relative: 35 % (ref 12–46)
MCHC: 32.3 g/dL (ref 30.0–36.0)
Neutro Abs: 3.3 10*3/uL (ref 1.7–7.7)
Neutrophils Relative %: 57 % (ref 43–77)
Platelets: 274 10*3/uL (ref 150–400)
RDW: 12.5 % (ref 11.5–15.5)
WBC: 5.8 10*3/uL (ref 4.0–10.5)

## 2010-10-31 LAB — COMPREHENSIVE METABOLIC PANEL
ALT: 20 U/L (ref 0–35)
AST: 20 U/L (ref 0–37)
Albumin: 4.8 g/dL (ref 3.5–5.2)
CO2: 27 mEq/L (ref 19–32)
Calcium: 9.4 mg/dL (ref 8.4–10.5)
Chloride: 98 mEq/L (ref 96–112)
Creat: 0.79 mg/dL (ref 0.50–1.10)
Potassium: 4 mEq/L (ref 3.5–5.3)
Total Protein: 7.7 g/dL (ref 6.0–8.3)

## 2010-10-31 NOTE — Patient Instructions (Signed)
We will call you with results of your CT scan.  We will give you further instructions based off of your results.

## 2010-10-31 NOTE — Progress Notes (Addendum)
History of Present Illness: Denise Griffin is a  25 y.o. female who presents today status post lap appy.  Pathology reveals acute appendicitis.  The patient is complaining of suprapubic abdominal pain that started this past Friday after having a bowel movement.  She had a CT of the abd/pelvis on 10/23/10 in the ED due to persistent RLQ abdominal pain, which was negative except for constipation.  Once the patient had a bowel movement last Friday she did note some dark black stools; however, she did take some pepto bismol prior to this.  She denies fevers, but admit to chills.  She states her blood pressure is low at night with a systolic in the low 80s.  She did see her PCP for this and was given a bolus of normal saline.  This did not help.  She has only had 2 bowel movements since her surgery.  She has been taking Miralax TID and Senna at night.  She tried an enema yesterday with some result.  She did feel better after this.  Physical Exam: Abd: soft, nondistended.   The patient is quite tender throughout her lower abdomen.  Greatest tenderness in the suprapubic region.  No guarding or peritoneal signs.  Hypoactive bowel sounds.  All incisions are well healed.  Impression: 1.  Acute appendicitis, s/p lap appy 2.  Post-op abdominal pain 3.  Constipation  Plan: She will be set up for a repeat CT scan of the abdomen and pelvis given the new abdominal pain that is debilitating her.  She is unable to stand for long periods of time and shaking at times secondary to pain.  She did have labs yesterday at [facility], but were unremarkable.  I suspect that the patient may still be suffering from constipation, but feel the best way to proceed, given her complaints, is to rule out a post operative complication.  I will have her CT scan report called to the on-call physician.  If patient's pain persist, I will likely have her follow-up with her operating surgeon.  Patient did state that Dr. Donell Beers told her after  surgery she needed a GI referral secondary to severe constipation.  I will give her a referral today for that.  In the meantime, I have recommended a clear liquid/full liquid type diet.          Patient went for CT scan, but location did not accept her insurance.  I informed her that I truly felt this pain was related to her constipation and not a post-op complication.  The only way to get a CT scan tonight is to go to the ED.  I as well as the patient did not feel this was necessary.  I cancelled the patient's CT scan.  I informed her to try her enemas and suppositories at home.  I have spoken with Cyndra Numbers, Dr. Arita Miss nurse, who will call and have the patient follow-up with Dr. Donell Beers in a week.

## 2010-11-14 ENCOUNTER — Other Ambulatory Visit (HOSPITAL_COMMUNITY): Payer: Self-pay | Admitting: Gastroenterology

## 2010-11-16 ENCOUNTER — Ambulatory Visit (HOSPITAL_COMMUNITY)
Admission: RE | Admit: 2010-11-16 | Discharge: 2010-11-16 | Disposition: A | Discharge status: Home / Self Care | Source: Ambulatory Visit | Attending: Gastroenterology | Admitting: Gastroenterology

## 2010-11-16 DIAGNOSIS — K5900 Constipation, unspecified: Secondary | ICD-10-CM | POA: Insufficient documentation

## 2010-11-24 ENCOUNTER — Other Ambulatory Visit (HOSPITAL_COMMUNITY): Payer: Self-pay | Admitting: Urology

## 2010-11-24 DIAGNOSIS — N390 Urinary tract infection, site not specified: Secondary | ICD-10-CM

## 2010-11-30 ENCOUNTER — Ambulatory Visit (HOSPITAL_COMMUNITY)
Admission: RE | Admit: 2010-11-30 | Discharge: 2010-11-30 | Disposition: A | Discharge status: Home / Self Care | Source: Ambulatory Visit | Attending: Urology | Admitting: Urology

## 2010-11-30 DIAGNOSIS — N390 Urinary tract infection, site not specified: Secondary | ICD-10-CM | POA: Insufficient documentation

## 2010-11-30 DIAGNOSIS — N1370 Vesicoureteral-reflux, unspecified: Secondary | ICD-10-CM | POA: Insufficient documentation

## 2010-11-30 MED ORDER — DIATRIZOATE MEGLUMINE 30 % UR SOLN
Freq: Once | URETHRAL | Status: AC | PRN
  Administered 2010-11-30: 600 mL

## 2010-12-14 LAB — POCT PREGNANCY, URINE
Operator id: 20873
Preg Test, Ur: NEGATIVE

## 2010-12-14 LAB — POCT HEMOGLOBIN-HEMACUE: Operator id: 134391

## 2011-01-05 LAB — POCT HEMOGLOBIN-HEMACUE: Hemoglobin: 12.8

## 2011-04-16 ENCOUNTER — Emergency Department (INDEPENDENT_AMBULATORY_CARE_PROVIDER_SITE_OTHER)

## 2011-04-16 ENCOUNTER — Emergency Department (HOSPITAL_BASED_OUTPATIENT_CLINIC_OR_DEPARTMENT_OTHER)
Admission: EM | Admit: 2011-04-16 | Discharge: 2011-04-16 | Disposition: A | Discharge status: Home / Self Care | Attending: Emergency Medicine | Admitting: Emergency Medicine

## 2011-04-16 ENCOUNTER — Encounter (HOSPITAL_BASED_OUTPATIENT_CLINIC_OR_DEPARTMENT_OTHER): Payer: Self-pay

## 2011-04-16 DIAGNOSIS — M542 Cervicalgia: Secondary | ICD-10-CM

## 2011-04-16 DIAGNOSIS — M549 Dorsalgia, unspecified: Secondary | ICD-10-CM

## 2011-04-16 MED ORDER — HYDROCODONE-ACETAMINOPHEN 5-500 MG PO TABS: 1.0000 | ORAL_TABLET | Freq: Four times a day (QID) | ORAL | Status: AC | PRN

## 2011-04-16 MED ORDER — HYDROCODONE-ACETAMINOPHEN 5-325 MG PO TABS
1.0000 | ORAL_TABLET | Freq: Once | ORAL | Status: AC
  Administered 2011-04-16: 1 via ORAL
  Filled 2011-04-16: qty 1

## 2011-04-16 NOTE — ED Provider Notes (Signed)
Medical screening examination/treatment/procedure(s) were performed by non-physician practitioner and as supervising physician I was immediately available for consultation/collaboration.   Chelsee Hosie, MD 04/16/11 2326 
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
## 2011-12-16 IMAGING — CR DG BE W/ AIR HIGH DENSITY
3 series · 3 of 3 positions shown · IV contrast (agent unspecified)
Comparison: CT 10/23/2010.

CLINICAL DATA: History of constipation. Post appendectomy.

AIR-CONTRAST BARIUM ENEMA
TECHNIQUE: After obtaining a scout radiograph air-contrast barium
enema was performed under fluoroscopy using high-density barium.
Fluoroscopy Time: 3.39 minutes
Contrast: High density barium and air

[view not recorded (1 of 3)]
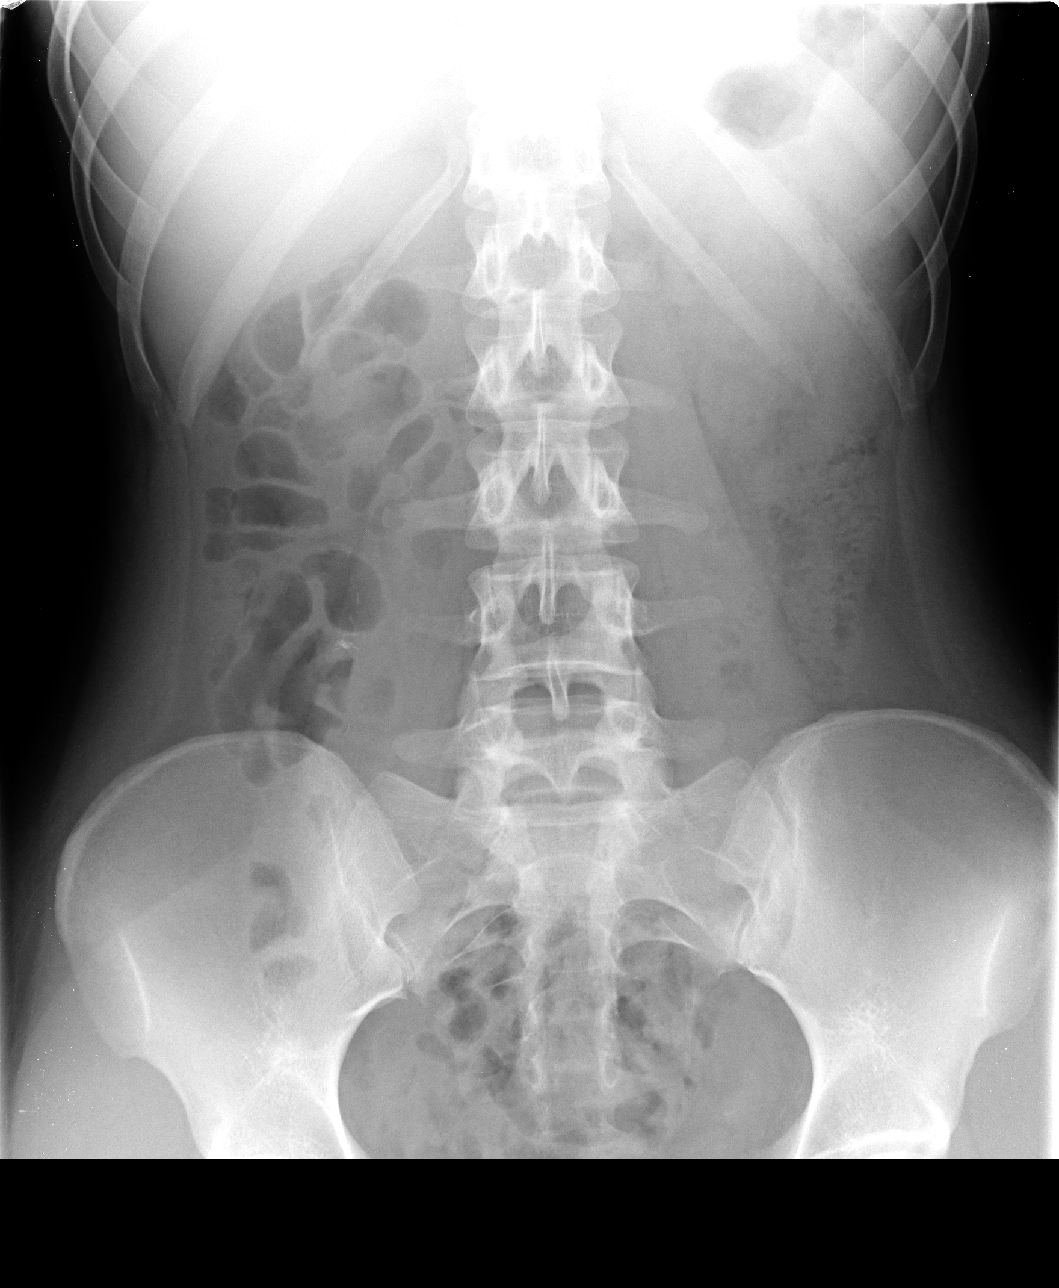

[view not recorded (2 of 3)]
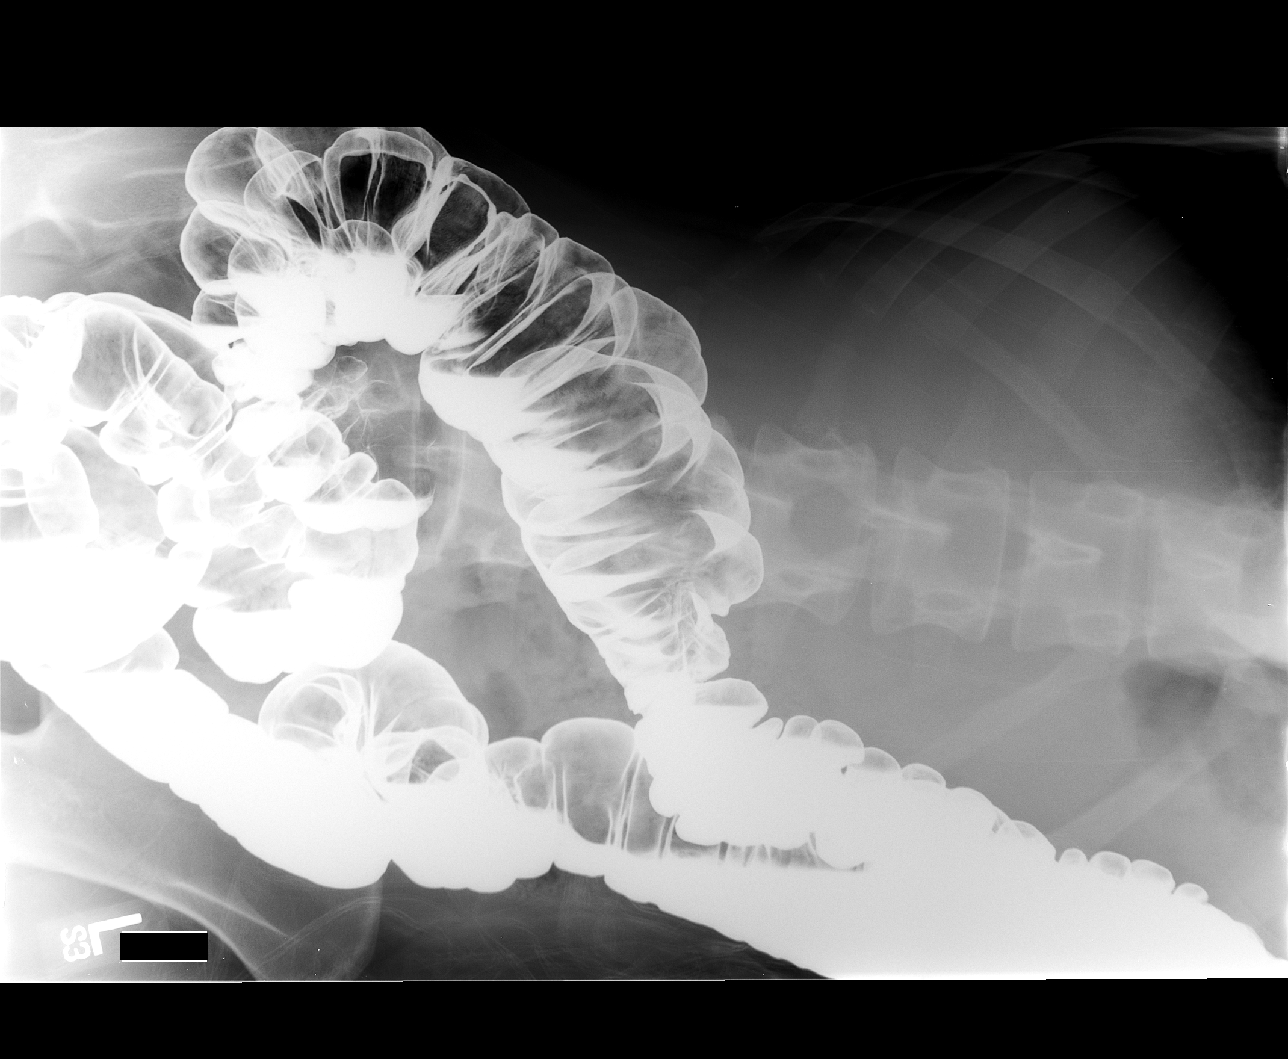

[view not recorded (3 of 3)]
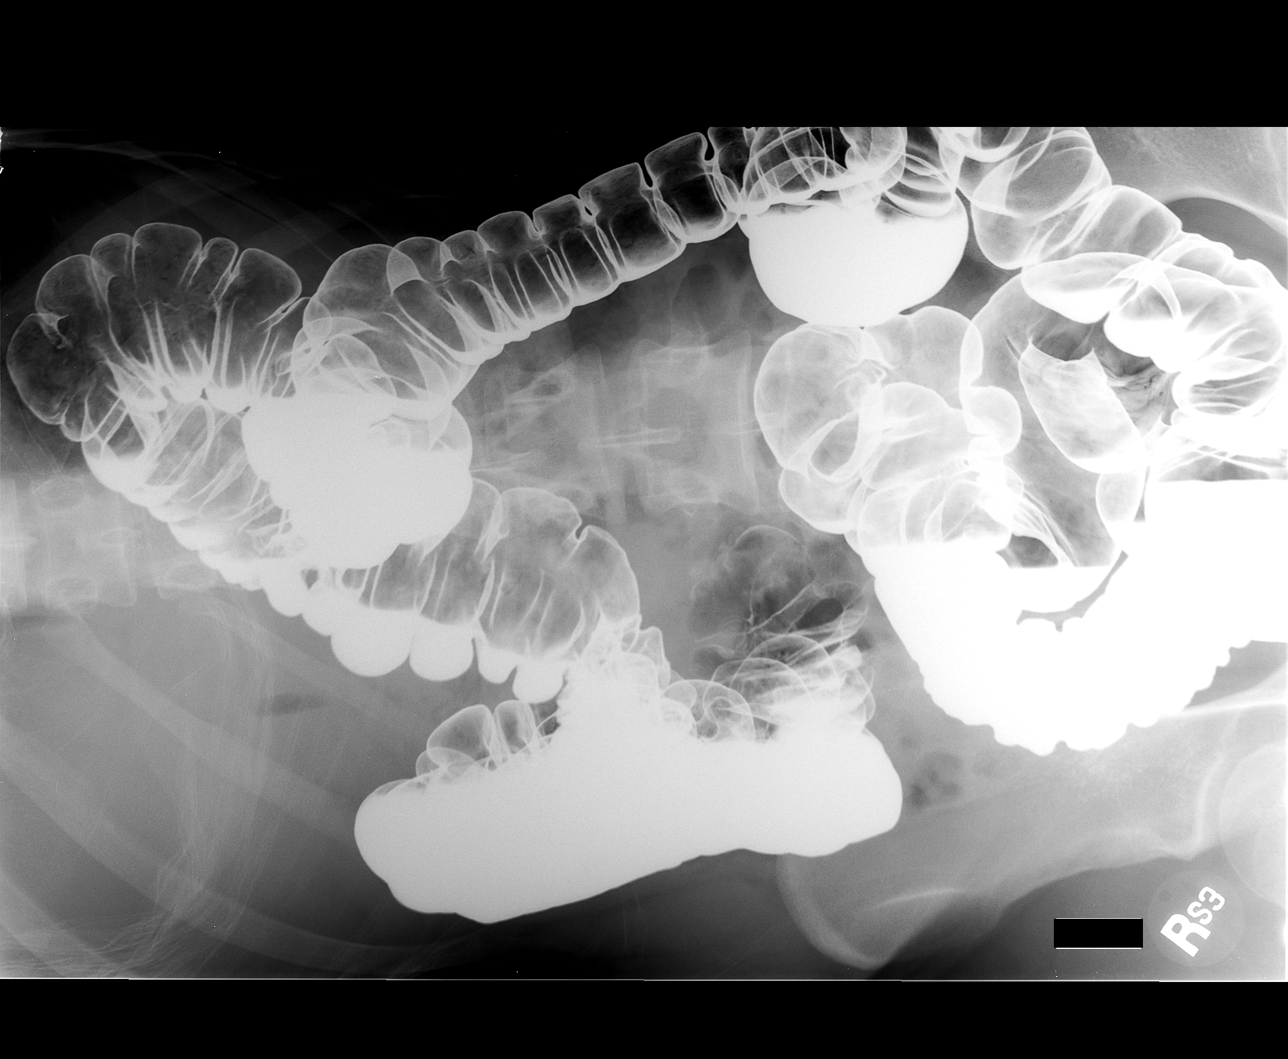

[3 of 3 positions shown; findings below may reference images not displayed]

FINDINGS: SI joints appear normal.  There is evidence of right
lower quadrant surgery consistent with history of appendectomy.

The entire colon was filled with high density barium and air.  I
was unable to reflux contrast into the terminal ileum.  History
given of previous appendectomy.  No appendix was evident.  The
colon is somewhat tortuous.  No colonic mass or obstruction was
seen.  There is no evidence of diverticulosis or diverticulitis.
No inflammatory changes were evident.  No polyps were evident.
IMPRESSION: Post appendectomy.  No colon lesion was identified.  Colon is
somewhat tortuous.

## 2014-12-03 LAB — OB RESULTS CONSOLE HEPATITIS B SURFACE ANTIGEN: Hepatitis B Surface Ag: NEGATIVE

## 2014-12-03 LAB — OB RESULTS CONSOLE HIV ANTIBODY (ROUTINE TESTING): HIV: NONREACTIVE

## 2014-12-03 LAB — OB RESULTS CONSOLE RUBELLA ANTIBODY, IGM: Rubella: IMMUNE

## 2014-12-03 LAB — OB RESULTS CONSOLE ABO/RH: RH Type: POSITIVE

## 2014-12-03 LAB — OB RESULTS CONSOLE GC/CHLAMYDIA
CHLAMYDIA, DNA PROBE: NEGATIVE
GC PROBE AMP, GENITAL: NEGATIVE

## 2014-12-03 LAB — OB RESULTS CONSOLE RPR: RPR: NONREACTIVE

## 2014-12-03 LAB — OB RESULTS CONSOLE ANTIBODY SCREEN: ANTIBODY SCREEN: NEGATIVE

## 2014-12-10 ENCOUNTER — Ambulatory Visit (HOSPITAL_COMMUNITY)
Admission: RE | Admit: 2014-12-10 | Discharge: 2014-12-10 | Disposition: A | Discharge status: Home / Self Care | Payer: Medicaid Other | Source: Ambulatory Visit | Attending: Family Medicine | Admitting: Family Medicine

## 2014-12-10 ENCOUNTER — Other Ambulatory Visit (HOSPITAL_COMMUNITY): Payer: Self-pay | Admitting: Family Medicine

## 2014-12-10 DIAGNOSIS — O3680X0 Pregnancy with inconclusive fetal viability, not applicable or unspecified: Secondary | ICD-10-CM

## 2014-12-14 ENCOUNTER — Emergency Department (HOSPITAL_COMMUNITY): Payer: Medicaid Other

## 2014-12-14 ENCOUNTER — Emergency Department (HOSPITAL_COMMUNITY)
Admission: EM | Admit: 2014-12-14 | Discharge: 2014-12-14 | Disposition: A | Discharge status: Home / Self Care | Payer: Medicaid Other | Attending: Emergency Medicine | Admitting: Emergency Medicine

## 2014-12-14 ENCOUNTER — Encounter (HOSPITAL_COMMUNITY): Payer: Self-pay | Admitting: *Deleted

## 2014-12-14 DIAGNOSIS — Z3A11 11 weeks gestation of pregnancy: Secondary | ICD-10-CM | POA: Diagnosis not present

## 2014-12-14 DIAGNOSIS — Z79899 Other long term (current) drug therapy: Secondary | ICD-10-CM | POA: Insufficient documentation

## 2014-12-14 DIAGNOSIS — M436 Torticollis: Secondary | ICD-10-CM | POA: Diagnosis not present

## 2014-12-14 DIAGNOSIS — Z91040 Latex allergy status: Secondary | ICD-10-CM | POA: Insufficient documentation

## 2014-12-14 DIAGNOSIS — O9989 Other specified diseases and conditions complicating pregnancy, childbirth and the puerperium: Secondary | ICD-10-CM | POA: Insufficient documentation

## 2014-12-14 DIAGNOSIS — Z8679 Personal history of other diseases of the circulatory system: Secondary | ICD-10-CM | POA: Diagnosis not present

## 2014-12-14 LAB — URINALYSIS, ROUTINE W REFLEX MICROSCOPIC
BILIRUBIN URINE: NEGATIVE
Glucose, UA: NEGATIVE mg/dL
HGB URINE DIPSTICK: NEGATIVE
KETONES UR: NEGATIVE mg/dL
Leukocytes, UA: NEGATIVE
Nitrite: NEGATIVE
PH: 7 (ref 5.0–8.0)
Protein, ur: NEGATIVE mg/dL
SPECIFIC GRAVITY, URINE: 1.019 (ref 1.005–1.030)
UROBILINOGEN UA: 0.2 mg/dL (ref 0.0–1.0)

## 2014-12-14 LAB — CBC WITH DIFFERENTIAL/PLATELET
BASOS ABS: 0 10*3/uL (ref 0.0–0.1)
BASOS PCT: 0 %
EOS ABS: 0.1 10*3/uL (ref 0.0–0.7)
EOS PCT: 1 %
HEMATOCRIT: 40.6 % (ref 36.0–46.0)
Hemoglobin: 13.5 g/dL (ref 12.0–15.0)
Lymphocytes Relative: 24 %
Lymphs Abs: 1.5 10*3/uL (ref 0.7–4.0)
MCH: 31.7 pg (ref 26.0–34.0)
MCHC: 33.3 g/dL (ref 30.0–36.0)
MCV: 95.3 fL (ref 78.0–100.0)
MONO ABS: 0.4 10*3/uL (ref 0.1–1.0)
MONOS PCT: 7 %
NEUTROS ABS: 4.3 10*3/uL (ref 1.7–7.7)
Neutrophils Relative %: 68 %
PLATELETS: 246 10*3/uL (ref 150–400)
RBC: 4.26 MIL/uL (ref 3.87–5.11)
RDW: 13.1 % (ref 11.5–15.5)
WBC: 6.3 10*3/uL (ref 4.0–10.5)

## 2014-12-14 LAB — BASIC METABOLIC PANEL
ANION GAP: 10 (ref 5–15)
BUN: 6 mg/dL (ref 6–20)
CALCIUM: 9.3 mg/dL (ref 8.9–10.3)
CO2: 23 mmol/L (ref 22–32)
CREATININE: 0.65 mg/dL (ref 0.44–1.00)
Chloride: 105 mmol/L (ref 101–111)
Glucose, Bld: 107 mg/dL — ABNORMAL HIGH (ref 65–99)
Potassium: 4 mmol/L (ref 3.5–5.1)
Sodium: 138 mmol/L (ref 135–145)

## 2014-12-14 LAB — PROTIME-INR
INR: 1.06 (ref 0.00–1.49)
Prothrombin Time: 14 seconds (ref 11.6–15.2)

## 2014-12-14 LAB — I-STAT TROPONIN, ED: Troponin i, poc: 0.01 ng/mL (ref 0.00–0.08)

## 2014-12-14 LAB — RAPID URINE DRUG SCREEN, HOSP PERFORMED
AMPHETAMINES: NOT DETECTED
BARBITURATES: NOT DETECTED
Benzodiazepines: NOT DETECTED
COCAINE: NOT DETECTED
OPIATES: NOT DETECTED
TETRAHYDROCANNABINOL: NOT DETECTED

## 2014-12-14 LAB — APTT: aPTT: 31 seconds (ref 24–37)

## 2014-12-14 LAB — POC URINE PREG, ED: Preg Test, Ur: POSITIVE — AB

## 2014-12-14 MED ORDER — CYCLOBENZAPRINE HCL 10 MG PO TABS: 10.0000 mg | ORAL_TABLET | Freq: Three times a day (TID) | ORAL | Status: DC | PRN

## 2014-12-14 MED ORDER — ACETAMINOPHEN 500 MG PO TABS
1000.0000 mg | ORAL_TABLET | Freq: Once | ORAL | Status: AC
  Administered 2014-12-14: 1000 mg via ORAL
  Filled 2014-12-14: qty 2

## 2014-12-14 MED ORDER — CYCLOBENZAPRINE HCL 10 MG PO TABS
5.0000 mg | ORAL_TABLET | ORAL | Status: AC
  Administered 2014-12-14: 5 mg via ORAL
  Filled 2014-12-14: qty 1

## 2014-12-14 NOTE — ED Notes (Addendum)
Patient was lying in bed and turned over to get up and she felt a pop in her right neck. She has been having shooting pain down her right side with any movement of the neck or right arm. Patient tried muscle rub, heat and ice. Patient is currently pregnant and does not want to take medications. Patient was unable to sit during triage because she has more pain with movement.
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
## 2014-12-14 NOTE — ED Notes (Signed)
Informed the pt that a urine specimen is needed. 

## 2014-12-14 NOTE — ED Provider Notes (Signed)
CSN: [id number]     Arrival date & time 12/14/14  1255 History   First MD Initiated Contact with Patient 12/14/14 1313     Chief Complaint  Patient presents with  . Torticollis     (Consider location/radiation/quality/duration/timing/severity/associated sxs/prior Treatment) HPI Comments: The patient is a 29 year old female who is approximately [redacted] weeks pregnant, thus far a very healthy pregnancy, presents with acute onset of pain in the left side of her neck that occurred when she tried to get out of bed after a midmorning nap. She reports feeling a crack in her neck as she tried to sit up, she felt acute onset of pain in her neck or arm and her leg with pain with moving the arm or the leg. She also reports some blurred vision in the right eye which is intermittent. She denies any symptoms on the left, denies chest pain or shortness of breath, denies fevers chills nausea or vomiting. She has never had any pain like this before. She has not been taking any medication during this pregnancy.  The history is provided by the patient.    Past Medical History  Diagnosis Date  . Seizures   . Hypotension   . Back pain   . Compression fracture of thoracic spine, non-traumatic    Past Surgical History  Procedure Laterality Date  . Cesarean section    . Appendectomy     No family history on file. Social History  Substance Use Topics  . Smoking status: Never Smoker   . Smokeless tobacco: None  . Alcohol Use: No   OB History    Gravida Para Term Preterm AB TAB SAB Ectopic Multiple Living   1              Review of Systems  All other systems reviewed and are negative.     Allergies  Latex  Home Medications   Prior to Admission medications   Medication Sig Start Date End Date Taking? Authorizing Provider  levothyroxine (SYNTHROID, LEVOTHROID) 75 MCG tablet Take 1 tablet by mouth daily. 10/19/14  Yes Historical Provider, MD  Prenat w/o A-FeCbn-Meth-FA-DHA (PRENATE MINI PO) Take 1  tablet by mouth daily.   Yes Historical Provider, MD  cyclobenzaprine (FLEXERIL) 10 MG tablet Take 1 tablet (10 mg total) by mouth 3 (three) times daily as needed for muscle spasms. 12/14/14   Eber Hong, MD   BP 106/60 mmHg  Pulse 83  Temp(Src) 98.5 F (36.9 C) (Oral)  Resp 28  SpO2 99%  LMP 09/26/2014 Physical Exam  Constitutional: She appears well-developed and well-nourished. No distress.  HENT:  Head: Normocephalic and atraumatic.  Mouth/Throat: Oropharynx is clear and moist. No oropharyngeal exudate.  Eyes: Conjunctivae and EOM are normal. Pupils are equal, round, and reactive to light. Right eye exhibits no discharge. Left eye exhibits no discharge. No scleral icterus.  Neck: No JVD present. No thyromegaly present.  Severe tenderness to palpation over the right side of the neck on the scalp down through the trapezius muscle. Torticollis present, head and towards the left, patient unable to move her head at all  Cardiovascular: Normal rate, regular rhythm, normal heart sounds and intact distal pulses.  Exam reveals no gallop and no friction rub.   No murmur heard. Pulmonary/Chest: Effort normal and breath sounds normal. No respiratory distress. She has no wheezes. She has no rales.  Abdominal: Soft. Bowel sounds are normal. She exhibits no distension and no mass. There is no tenderness.  Musculoskeletal: Normal range of  motion. She exhibits no edema or tenderness.  Lymphadenopathy:    She has no cervical adenopathy.  Neurological: She is alert. Coordination normal.  Normal strength and sensation in all 4 extremities, severe pain with her neck with any movement of the arm or the leg.  Skin: Skin is warm and dry. No rash noted. No erythema.  Psychiatric: She has a normal mood and affect. Her behavior is normal.  Nursing note and vitals reviewed.   ED Course  Procedures (including critical care time) Labs Review Labs Reviewed  BASIC METABOLIC PANEL - Abnormal; Notable for the  following:    Glucose, Bld 107 (*)    All other components within normal limits  POC URINE PREG, ED - Abnormal; Notable for the following:    Preg Test, Ur POSITIVE (*)    All other components within normal limits  CBC WITH DIFFERENTIAL/PLATELET  PROTIME-INR  APTT  URINE RAPID DRUG SCREEN, HOSP PERFORMED  URINALYSIS, ROUTINE W REFLEX MICROSCOPIC (NOT AT [facility])  Rosezena Sensor, ED    Imaging Review Mr Cervical Spine Wo Contrast  12/14/2014   CLINICAL DATA:  Acute onset neck pain and right arm numbness. Pop in neck.  EXAM: MRI CERVICAL SPINE WITHOUT CONTRAST  TECHNIQUE: Multiplanar, multisequence MR imaging of the cervical spine was performed. No intravenous contrast was administered.  COMPARISON:  Cervical spine radiographs 04/16/2011  FINDINGS: Slight reversal of the normal cervical lordosis is unchanged. There is no significant listhesis. Vertebral body heights and intervertebral disc space heights are preserved. Vertebral bone marrow signal is within normal limits. Craniocervical junction is unremarkable. Cervical spinal cord is normal in caliber and signal. Paraspinal soft tissues are unremarkable.  No disc herniation is seen. Spinal canal and neuroforamina appear widely patent.  IMPRESSION: Negative cervical spine MRI. No disc herniation or stenosis identified.   Electronically Signed   By: Sebastian Ache M.D.   On: 12/14/2014 20:27   I have personally reviewed and evaluated these images and lab results as part of my medical decision-making.    MDM   Final diagnoses:  Torticollis, acute    The patient likely has muscular injury or straining, much less likely to be related to cervical disc though with her symptoms in the arm and the leg even at rest will obtain MRI of the neck to be sure. Vital signs unremarkable, she is no longer tachycardic. Labs unremarkable, ordered prior to my evaluation.  The patient has unremarkable vital signs, she is not tachypneic on my exam, her MRI shows  nothing acute - improved with Flexeril, category B, Rx for home, pt in agreement.  Meds given in ED:  Medications  acetaminophen (TYLENOL) tablet 1,000 mg (1,000 mg Oral Given 12/14/14 1709)  cyclobenzaprine (FLEXERIL) tablet 5 mg (5 mg Oral Given 12/14/14 1709)    New Prescriptions   CYCLOBENZAPRINE (FLEXERIL) 10 MG TABLET    Take 1 tablet (10 mg total) by mouth 3 (three) times daily as needed for muscle spasms.        Eber Hong, MD 12/14/14 2042

## 2014-12-14 NOTE — Discharge Instructions (Signed)

## 2015-05-18 ENCOUNTER — Inpatient Hospital Stay (HOSPITAL_COMMUNITY)
Admission: AD | Admit: 2015-05-18 | Discharge: 2015-05-18 | Disposition: A | Discharge status: Home / Self Care | Payer: Medicaid Other | Source: Ambulatory Visit | Attending: Obstetrics and Gynecology | Admitting: Obstetrics and Gynecology

## 2015-05-18 ENCOUNTER — Encounter (HOSPITAL_COMMUNITY): Payer: Self-pay | Admitting: *Deleted

## 2015-05-18 DIAGNOSIS — R42 Dizziness and giddiness: Secondary | ICD-10-CM | POA: Insufficient documentation

## 2015-05-18 DIAGNOSIS — R102 Pelvic and perineal pain: Secondary | ICD-10-CM | POA: Insufficient documentation

## 2015-05-18 DIAGNOSIS — Z79899 Other long term (current) drug therapy: Secondary | ICD-10-CM | POA: Diagnosis not present

## 2015-05-18 DIAGNOSIS — R5383 Other fatigue: Secondary | ICD-10-CM | POA: Insufficient documentation

## 2015-05-18 DIAGNOSIS — E039 Hypothyroidism, unspecified: Secondary | ICD-10-CM | POA: Diagnosis not present

## 2015-05-18 DIAGNOSIS — O26899 Other specified pregnancy related conditions, unspecified trimester: Secondary | ICD-10-CM | POA: Insufficient documentation

## 2015-05-18 DIAGNOSIS — R55 Syncope and collapse: Secondary | ICD-10-CM | POA: Insufficient documentation

## 2015-05-18 DIAGNOSIS — Z91040 Latex allergy status: Secondary | ICD-10-CM | POA: Insufficient documentation

## 2015-05-18 DIAGNOSIS — R609 Edema, unspecified: Secondary | ICD-10-CM | POA: Insufficient documentation

## 2015-05-18 DIAGNOSIS — H539 Unspecified visual disturbance: Secondary | ICD-10-CM | POA: Insufficient documentation

## 2015-05-18 DIAGNOSIS — R0602 Shortness of breath: Secondary | ICD-10-CM | POA: Diagnosis not present

## 2015-05-18 HISTORY — DX: Hypothyroidism, unspecified: E03.9

## 2015-05-18 HISTORY — DX: Polycystic ovarian syndrome: E28.2

## 2015-05-18 LAB — PROTEIN / CREATININE RATIO, URINE: CREATININE, URINE: 62 mg/dL

## 2015-05-18 LAB — CBC WITH DIFFERENTIAL/PLATELET
BASOS ABS: 0 10*3/uL (ref 0.0–0.1)
Basophils Relative: 0 %
EOS PCT: 2 %
Eosinophils Absolute: 0.1 10*3/uL (ref 0.0–0.7)
HEMATOCRIT: 33.4 % — AB (ref 36.0–46.0)
Hemoglobin: 11.3 g/dL — ABNORMAL LOW (ref 12.0–15.0)
LYMPHS PCT: 24 %
Lymphs Abs: 1.8 10*3/uL (ref 0.7–4.0)
MCH: 31.1 pg (ref 26.0–34.0)
MCHC: 33.8 g/dL (ref 30.0–36.0)
MCV: 92 fL (ref 78.0–100.0)
MONO ABS: 0.6 10*3/uL (ref 0.1–1.0)
MONOS PCT: 8 %
NEUTROS ABS: 4.9 10*3/uL (ref 1.7–7.7)
Neutrophils Relative %: 66 %
PLATELETS: 179 10*3/uL (ref 150–400)
RBC: 3.63 MIL/uL — ABNORMAL LOW (ref 3.87–5.11)
RDW: 13.4 % (ref 11.5–15.5)
WBC: 7.4 10*3/uL (ref 4.0–10.5)

## 2015-05-18 LAB — COMPREHENSIVE METABOLIC PANEL
ALK PHOS: 48 U/L (ref 38–126)
ALT: 11 U/L — ABNORMAL LOW (ref 14–54)
ANION GAP: 9 (ref 5–15)
AST: 22 U/L (ref 15–41)
Albumin: 3 g/dL — ABNORMAL LOW (ref 3.5–5.0)
BILIRUBIN TOTAL: 0.5 mg/dL (ref 0.3–1.2)
BUN: 6 mg/dL (ref 6–20)
CALCIUM: 8.9 mg/dL (ref 8.9–10.3)
CO2: 22 mmol/L (ref 22–32)
Chloride: 107 mmol/L (ref 101–111)
Creatinine, Ser: 0.57 mg/dL (ref 0.44–1.00)
Glucose, Bld: 111 mg/dL — ABNORMAL HIGH (ref 65–99)
POTASSIUM: 3.4 mmol/L — AB (ref 3.5–5.1)
Sodium: 138 mmol/L (ref 135–145)
TOTAL PROTEIN: 6 g/dL — AB (ref 6.5–8.1)

## 2015-05-18 LAB — URINALYSIS, ROUTINE W REFLEX MICROSCOPIC
Bilirubin Urine: NEGATIVE
Glucose, UA: NEGATIVE mg/dL
Ketones, ur: NEGATIVE mg/dL
Leukocytes, UA: NEGATIVE
Nitrite: NEGATIVE
PH: 6 (ref 5.0–8.0)
Protein, ur: NEGATIVE mg/dL
SPECIFIC GRAVITY, URINE: 1.01 (ref 1.005–1.030)

## 2015-05-18 LAB — URINE MICROSCOPIC-ADD ON

## 2015-05-18 LAB — URIC ACID: URIC ACID, SERUM: 5 mg/dL (ref 2.3–6.6)

## 2015-05-18 LAB — LACTATE DEHYDROGENASE: LDH: 124 U/L (ref 98–192)

## 2015-05-18 MED ORDER — DOXYLAMINE SUCCINATE (SLEEP) 25 MG PO TABS: 25.0000 mg | ORAL_TABLET | Freq: Every evening | ORAL | Status: DC | PRN

## 2015-05-18 NOTE — MAU Note (Signed)
C/o feeling SOB today; c/o feeling weak and dizzy today also; has swelling in her feet and hands; c/o visual changes today also; c/o nausea also today;

## 2015-05-18 NOTE — MAU Provider Note (Addendum)
Denise Griffin is a 30 y.o. G2P1001 at 33.3 weeks arrived from the office with reports of a HA, vision changes, dizzyness, SOB.  Pt denies vb, lof or ctx w/+fm   History     There are no active problems to display for this patient.   Chief Complaint  Patient presents with  . Shortness of Breath  . Dizziness  . Eye Problem  . Fatigue   HPI  OB History    Gravida Para Term Preterm AB TAB SAB Ectopic Multiple Living   2 1 1       1       Past Medical History  Diagnosis Date  . Seizures (HCC)   . Hypotension   . Back pain   . Compression fracture of thoracic spine, non-traumatic (HCC)   . Hypothyroidism   . PCOS (polycystic ovarian syndrome)   . Infertility, female     Past Surgical History  Procedure Laterality Date  . Cesarean section    . Appendectomy    . Ankle surgery    . Laparoscopy      Family History  Problem Relation Age of Onset  . Alcohol abuse Neg Hx   . Arthritis Neg Hx   . Asthma Neg Hx   . Birth defects Neg Hx   . Cancer Neg Hx   . COPD Neg Hx   . Depression Neg Hx   . Diabetes Neg Hx   . Drug abuse Neg Hx   . Early death Neg Hx   . Heart disease Neg Hx   . Hearing loss Neg Hx   . Hypertension Neg Hx   . Hyperlipidemia Neg Hx   . Kidney disease Neg Hx   . Learning disabilities Neg Hx   . Mental illness Neg Hx   . Miscarriages / Stillbirths Neg Hx   . Mental retardation Neg Hx   . Stroke Neg Hx   . Vision loss Neg Hx   . Varicose Veins Neg Hx     Social History  Substance Use Topics  . Smoking status: Never Smoker   . Smokeless tobacco: None  . Alcohol Use: No    Allergies:  Allergies  Allergen Reactions  . Latex Rash    Prescriptions prior to admission  Medication Sig Dispense Refill Last Dose  . levothyroxine (SYNTHROID, LEVOTHROID) 75 MCG tablet Take 1 tablet by mouth daily.   05/18/2015 at Unknown time  . pantoprazole (PROTONIX) 20 MG tablet Take 20 mg by mouth daily.   05/18/2015 at Unknown time  . Prenatal Vit-Fe  Fumarate-FA (PRENATAL MULTIVITAMIN) TABS tablet Take 1 tablet by mouth daily at 12 noon.   05/18/2015 at Unknown time  . cyclobenzaprine (FLEXERIL) 10 MG tablet Take 1 tablet (10 mg total) by mouth 3 (three) times daily as needed for muscle spasms. (Patient not taking: Reported on 05/18/2015) 30 tablet 0 Not Taking at Unknown time    ROS See HPI above, all other systems are negative  Physical Exam   Blood pressure 110/75, pulse 100, temperature 98.7 F (37.1 C), resp. rate 18, last menstrual period 09/26/2014, SpO2 95 %.  Physical Exam Ext:  WNL ABD: Soft, non tender to palpation, no rebound or guarding SVE: deferred   ED Course  Assessment: IUP at  33.3weeks Membranes: intact FHR: Category 1 CTX:  none   Plan: PIH labs   Venus Standard, CNM, MSN 05/18/2015. 6:59 PM   Addendum 2039 S: Patient reports resolution of all symptoms, but reports still feeling "  tired."  Reports inability to sleep due to pain and thirst.  Patient states she has not attempted to take sleep aide or pain medication for pelvic pain.  Patient recalls series of events that occurred in office and feels that it may be related "to bottoming out," rather than high blood pressure.  Patient reports eating prior to event (2 fish sandwiches) and denies personal history of bp or diabetes.    O:  Results for orders placed or performed during the hospital encounter of 05/18/15 (from the past 24 hour(s))  Urinalysis, Routine w reflex microscopic (not at [facility])     Status: Abnormal   Collection Time: 05/18/15  5:40 PM  Result Value Ref Range   Color, Urine YELLOW YELLOW   APPearance CLEAR CLEAR   Specific Gravity, Urine 1.010 1.005 - 1.030   pH 6.0 5.0 - 8.0   Glucose, UA NEGATIVE NEGATIVE mg/dL   Hgb urine dipstick TRACE (A) NEGATIVE   Bilirubin Urine NEGATIVE NEGATIVE   Ketones, ur NEGATIVE NEGATIVE mg/dL   Protein, ur NEGATIVE NEGATIVE mg/dL   Nitrite NEGATIVE NEGATIVE   Leukocytes, UA NEGATIVE NEGATIVE  Urine  microscopic-add on     Status: Abnormal   Collection Time: 05/18/15  5:40 PM  Result Value Ref Range   Squamous Epithelial / LPF 0-5 (A) NONE SEEN   WBC, UA 0-5 0 - 5 WBC/hpf   RBC / HPF 0-5 0 - 5 RBC/hpf   Bacteria, UA RARE (A) NONE SEEN  CBC with Differential/Platelet     Status: Abnormal   Collection Time: 05/18/15  6:51 PM  Result Value Ref Range   WBC 7.4 4.0 - 10.5 K/uL   RBC 3.63 (L) 3.87 - 5.11 MIL/uL   Hemoglobin 11.3 (L) 12.0 - 15.0 g/dL   HCT 16.1 (L) 09.6 - 04.5 %   MCV 92.0 78.0 - 100.0 fL   MCH 31.1 26.0 - 34.0 pg   MCHC 33.8 30.0 - 36.0 g/dL   RDW 40.9 81.1 - 91.4 %   Platelets 179 150 - 400 K/uL   Neutrophils Relative % 66 %   Neutro Abs 4.9 1.7 - 7.7 K/uL   Lymphocytes Relative 24 %   Lymphs Abs 1.8 0.7 - 4.0 K/uL   Monocytes Relative 8 %   Monocytes Absolute 0.6 0.1 - 1.0 K/uL   Eosinophils Relative 2 %   Eosinophils Absolute 0.1 0.0 - 0.7 K/uL   Basophils Relative 0 %   Basophils Absolute 0.0 0.0 - 0.1 K/uL  Lactate dehydrogenase     Status: None   Collection Time: 05/18/15  6:51 PM  Result Value Ref Range   LDH 124 98 - 192 U/L  Comprehensive metabolic panel     Status: Abnormal   Collection Time: 05/18/15  6:51 PM  Result Value Ref Range   Sodium 138 135 - 145 mmol/L   Potassium 3.4 (L) 3.5 - 5.1 mmol/L   Chloride 107 101 - 111 mmol/L   CO2 22 22 - 32 mmol/L   Glucose, Bld 111 (H) 65 - 99 mg/dL   BUN 6 6 - 20 mg/dL   Creatinine, Ser 7.82 0.44 - 1.00 mg/dL   Calcium 8.9 8.9 - 95.6 mg/dL   Total Protein 6.0 (L) 6.5 - 8.1 g/dL   Albumin 3.0 (L) 3.5 - 5.0 g/dL   AST 22 15 - 41 U/L   ALT 11 (L) 14 - 54 U/L   Alkaline Phosphatase 48 38 - 126 U/L   Total Bilirubin 0.5  0.3 - 1.2 mg/dL   GFR calc non Af Amer >60 >60 mL/min   GFR calc Af Amer >60 >60 mL/min   Anion gap 9 5 - 15  Uric acid     Status: None   Collection Time: 05/18/15  6:51 PM  Result Value Ref Range   Uric Acid, Serum 5.0 2.3 - 6.6 mg/dL   A: IUP at 19.1YNW Syncope Episode Visual  Disturbances Edema Dizziness/SOB Fatigue  P:  Discussed need for proper rest at night and throughout day.  Patient agrees to [facility] for sleep aide. Discussed proper nutritional intake and hydration Educated regarding pain mgmt and comfort measures for pregnancy discomforts Informed of lab results-All normal PC Ratio Pending Informed that if PC Ratio abnormal may require return to hospital for inpt mgmt-verbalized understanding No Q/C Rx for unisom  at HS prn Disp 30, RF 0 Encouraged to call if any questions or concerns arise prior to next scheduled office visit.  Keep appt as scheduled: 3/6 Discharged to home in improved condition.  Cherre Robins MSN, CNM 05/18/2015 8:46 PM

## 2015-05-18 NOTE — Discharge Instructions (Signed)
Fatigue  Fatigue is feeling tired all of the time, a lack of energy, or a lack of motivation. Occasional or mild fatigue is often a normal response to activity or life in general. However, long-lasting (chronic) or extreme fatigue may indicate an underlying medical condition.  HOME CARE INSTRUCTIONS   Watch your fatigue for any changes. The following actions may help to lessen any discomfort you are feeling:  · Talk to your health care provider about how much sleep you need each night. Try to get the required amount every night.  · Take medicines only as directed by your health care provider.  · Eat a healthy and nutritious diet. Ask your health care provider if you need help changing your diet.  · Drink enough fluid to keep your urine clear or pale yellow.  · Practice ways of relaxing, such as yoga, meditation, massage therapy, or acupuncture.  · Exercise regularly.    · Change situations that cause you stress. Try to keep your work and personal routine reasonable.  · Do not abuse illegal drugs.  · Limit alcohol intake to no more than 1 drink per day for nonpregnant women and 2 drinks per day for men. One drink equals 12 ounces of beer, 5 ounces of wine, or 1½ ounces of hard liquor.  · Take a multivitamin, if directed by your health care provider.  SEEK MEDICAL CARE IF:   · Your fatigue does not get better.  · You have a fever.    · You have unintentional weight loss or gain.  · You have headaches.    · You have difficulty:      Falling asleep.    Sleeping throughout the night.  · You feel angry, guilty, anxious, or sad.     · You are unable to have a bowel movement (constipation).    · You skin is dry.     · Your legs or another part of your body is swollen.    SEEK IMMEDIATE MEDICAL CARE IF:   · You feel confused.    · Your vision is blurry.  · You feel faint or pass out.    · You have a severe headache.    · You have severe abdominal, pelvic, or back pain.    · You have chest pain, shortness of breath, or an  irregular or fast heartbeat.    · You are unable to urinate or you urinate less than normal.    · You develop abnormal bleeding, such as bleeding from the rectum, vagina, nose, lungs, or nipples.  · You vomit blood.     · You have thoughts about harming yourself or committing suicide.    · You are worried that you might harm someone else.       This information is not intended to replace advice given to you by your health care provider. Make sure you discuss any questions you have with your health care provider.     Document Released: 01/07/2007 Document Revised: 04/02/2014 Document Reviewed: 07/14/2013  Elsevier Interactive Patient Education ©2016 [employer]

## 2015-06-01 ENCOUNTER — Other Ambulatory Visit: Payer: Self-pay | Admitting: Obstetrics and Gynecology

## 2015-06-22 ENCOUNTER — Inpatient Hospital Stay (HOSPITAL_COMMUNITY)
Admission: AD | Admit: 2015-06-22 | Discharge: 2015-06-22 | Disposition: A | Discharge status: Home / Self Care | Payer: Medicaid Other | Source: Ambulatory Visit | Attending: Obstetrics and Gynecology | Admitting: Obstetrics and Gynecology

## 2015-06-22 ENCOUNTER — Encounter (HOSPITAL_COMMUNITY): Payer: Self-pay | Admitting: *Deleted

## 2015-06-22 ENCOUNTER — Telehealth (HOSPITAL_COMMUNITY): Payer: Self-pay | Admitting: *Deleted

## 2015-06-22 DIAGNOSIS — R000 Tachycardia, unspecified: Secondary | ICD-10-CM | POA: Insufficient documentation

## 2015-06-22 DIAGNOSIS — E039 Hypothyroidism, unspecified: Secondary | ICD-10-CM | POA: Insufficient documentation

## 2015-06-22 DIAGNOSIS — O26893 Other specified pregnancy related conditions, third trimester: Secondary | ICD-10-CM | POA: Diagnosis not present

## 2015-06-22 DIAGNOSIS — O99283 Endocrine, nutritional and metabolic diseases complicating pregnancy, third trimester: Secondary | ICD-10-CM | POA: Diagnosis not present

## 2015-06-22 DIAGNOSIS — O212 Late vomiting of pregnancy: Secondary | ICD-10-CM | POA: Diagnosis not present

## 2015-06-22 DIAGNOSIS — R112 Nausea with vomiting, unspecified: Secondary | ICD-10-CM

## 2015-06-22 DIAGNOSIS — Z3A38 38 weeks gestation of pregnancy: Secondary | ICD-10-CM | POA: Diagnosis not present

## 2015-06-22 LAB — URINALYSIS, ROUTINE W REFLEX MICROSCOPIC
BILIRUBIN URINE: NEGATIVE
Glucose, UA: NEGATIVE mg/dL
Hgb urine dipstick: NEGATIVE
LEUKOCYTES UA: NEGATIVE
NITRITE: NEGATIVE
PH: 7 (ref 5.0–8.0)
PROTEIN: NEGATIVE mg/dL
Specific Gravity, Urine: 1.015 (ref 1.005–1.030)

## 2015-06-22 MED ORDER — LACTATED RINGERS IV SOLN
INTRAVENOUS | Status: DC
  Administered 2015-06-22: 07:00:00 via INTRAVENOUS

## 2015-06-22 MED ORDER — DEXTROSE IN LACTATED RINGERS 5 % IV SOLN
INTRAVENOUS | Status: DC
  Administered 2015-06-22: 09:00:00 via INTRAVENOUS

## 2015-06-22 MED ORDER — ONDANSETRON HCL 4 MG/2ML IJ SOLN
4.0000 mg | Freq: Once | INTRAMUSCULAR | Status: AC
  Administered 2015-06-22: 4 mg via INTRAVENOUS
  Filled 2015-06-22: qty 2

## 2015-06-22 MED ORDER — ONDANSETRON 8 MG PO TBDP: 8.0000 mg | ORAL_TABLET | Freq: Three times a day (TID) | ORAL | Status: DC | PRN

## 2015-06-22 NOTE — MAU Note (Signed)
N/V/D since last night. Unable to keep a sip of water down. Vomited shortly after arrival to room. Yellow liquid.

## 2015-06-22 NOTE — Discharge Instructions (Signed)

## 2015-06-22 NOTE — Discharge Summary (Signed)
[facility]  Denise Griffin  DOB:    [birth year] MRN:    [id number] CSN:    [id number]  Date of admission:                    06/22/2015  Date of discharge:                     Same  Admission Diagnosis:  38 week 3 day gestation  Nausea, vomiting, diarrhea  Probable viral syndrome  Hypothyroidism  History of seizure disorder  Fatigue  Dehydration  Discharge Diagnosis:  Same  Procedures this admission:  IV fluids, observation, IV anti-emetics  History of Present Illness:  [name] is a 30 y.o. female, G2P1001, who presents at 2062w3d weeks gestation. The patient [facility]. She was admitted observation/evaluation. Her pregnancy has been complicated by: Hypothyroidism and a history of seizure disorders. The patient presents today complaining of nausea, vomiting, and diarrhea. She has been unable to keep any food down. She is beginning to feel weak. She denies rupture membranes and vaginal bleeding.  Hospital course:  The patient was evaluated in the maternity admissions area. She was given IV fluids and IV Zofran. Initially she was tachycardic. Her pulse did return to normal. She felt much better after several hours of observation. Her baby had a category 1 fetal heart rate tracing.She was discharged to home doing well.  Discharge Physical Exam:  BP 118/84 mmHg  Pulse 106  Temp(Src) 98.7 F (37.1 C) (Oral)  Resp 20  SpO2 95%  LMP 09/26/2014   General: alert and no distress Resp: clear to auscultation bilaterally Cardio: regular rate and rhythm, S1, S2 normal, no murmur, click, rub or gallop GI: Gravid, nontender.  Exam deferred.  Discharge Information:  Activity:           unrestricted Diet:                Bland diet as tolerated Medications: Zofran 8 mg every 8 hours as needed  for nausea Condition:      stable and improved Instructions:  Preprinted material as specified in Epic Discharge to: home  Follow-up Information    Follow up with [facility] In 1 week.   Specialty:  Obstetrics and Gynecology   Contact information:   [address] [place] [address] [phone]        Denise Griffin,Denise Griffin 06/22/2015

## 2015-06-22 NOTE — Telephone Encounter (Signed)
Preadmission screen  

## 2015-06-24 ENCOUNTER — Telehealth (HOSPITAL_COMMUNITY): Payer: Self-pay | Admitting: *Deleted

## 2015-06-24 NOTE — Telephone Encounter (Signed)
Preadmission screenPreadmission screen 

## 2015-06-28 ENCOUNTER — Inpatient Hospital Stay (HOSPITAL_COMMUNITY): Payer: Medicaid Other | Admitting: Anesthesiology

## 2015-06-28 ENCOUNTER — Encounter (HOSPITAL_COMMUNITY)
Admission: AD | Disposition: A | Discharge status: Home / Self Care | Payer: Self-pay | Source: Ambulatory Visit | Attending: Obstetrics and Gynecology

## 2015-06-28 ENCOUNTER — Inpatient Hospital Stay (HOSPITAL_COMMUNITY)
Admission: AD | Admit: 2015-06-28 | Discharge: 2015-06-30 | DRG: 765 | Disposition: A | Discharge status: Home / Self Care | Payer: Medicaid Other | Source: Ambulatory Visit | Attending: Obstetrics and Gynecology | Admitting: Obstetrics and Gynecology

## 2015-06-28 ENCOUNTER — Encounter (HOSPITAL_COMMUNITY): Payer: Self-pay | Admitting: *Deleted

## 2015-06-28 ENCOUNTER — Encounter (HOSPITAL_COMMUNITY)
Admit: 2015-06-28 | Discharge: 2015-06-28 | Disposition: A | Discharge status: Home / Self Care | Payer: Medicaid Other | Attending: Obstetrics and Gynecology | Admitting: Obstetrics and Gynecology

## 2015-06-28 DIAGNOSIS — K219 Gastro-esophageal reflux disease without esophagitis: Secondary | ICD-10-CM | POA: Diagnosis present

## 2015-06-28 DIAGNOSIS — O34211 Maternal care for low transverse scar from previous cesarean delivery: Secondary | ICD-10-CM | POA: Diagnosis present

## 2015-06-28 DIAGNOSIS — Z302 Encounter for sterilization: Secondary | ICD-10-CM

## 2015-06-28 DIAGNOSIS — Z91040 Latex allergy status: Secondary | ICD-10-CM

## 2015-06-28 DIAGNOSIS — E282 Polycystic ovarian syndrome: Secondary | ICD-10-CM | POA: Diagnosis present

## 2015-06-28 DIAGNOSIS — O99284 Endocrine, nutritional and metabolic diseases complicating childbirth: Secondary | ICD-10-CM | POA: Diagnosis present

## 2015-06-28 DIAGNOSIS — O99214 Obesity complicating childbirth: Secondary | ICD-10-CM | POA: Diagnosis present

## 2015-06-28 DIAGNOSIS — E669 Obesity, unspecified: Secondary | ICD-10-CM | POA: Diagnosis present

## 2015-06-28 DIAGNOSIS — Z8742 Personal history of other diseases of the female genital tract: Secondary | ICD-10-CM

## 2015-06-28 DIAGNOSIS — E039 Hypothyroidism, unspecified: Secondary | ICD-10-CM | POA: Diagnosis present

## 2015-06-28 DIAGNOSIS — O34219 Maternal care for unspecified type scar from previous cesarean delivery: Secondary | ICD-10-CM | POA: Diagnosis present

## 2015-06-28 DIAGNOSIS — R569 Unspecified convulsions: Secondary | ICD-10-CM | POA: Diagnosis present

## 2015-06-28 DIAGNOSIS — O9962 Diseases of the digestive system complicating childbirth: Secondary | ICD-10-CM | POA: Diagnosis present

## 2015-06-28 DIAGNOSIS — O99354 Diseases of the nervous system complicating childbirth: Secondary | ICD-10-CM | POA: Diagnosis present

## 2015-06-28 DIAGNOSIS — Z3A39 39 weeks gestation of pregnancy: Secondary | ICD-10-CM | POA: Diagnosis not present

## 2015-06-28 DIAGNOSIS — Z6837 Body mass index (BMI) 37.0-37.9, adult: Secondary | ICD-10-CM

## 2015-06-28 DIAGNOSIS — M549 Dorsalgia, unspecified: Secondary | ICD-10-CM

## 2015-06-28 DIAGNOSIS — G8929 Other chronic pain: Secondary | ICD-10-CM | POA: Diagnosis present

## 2015-06-28 HISTORY — PX: TUBAL LIGATION: SHX77

## 2015-06-28 HISTORY — DX: Personal history of other diseases of the female genital tract: Z87.42

## 2015-06-28 LAB — ABO/RH: ABO/RH(D): O POS

## 2015-06-28 LAB — TYPE AND SCREEN
ABO/RH(D): O POS
Antibody Screen: NEGATIVE

## 2015-06-28 LAB — CBC
HCT: 36.7 % (ref 36.0–46.0)
HCT: 37.3 % (ref 36.0–46.0)
HEMOGLOBIN: 12.4 g/dL (ref 12.0–15.0)
Hemoglobin: 12.7 g/dL (ref 12.0–15.0)
MCH: 30.5 pg (ref 26.0–34.0)
MCH: 30.6 pg (ref 26.0–34.0)
MCHC: 33.8 g/dL (ref 30.0–36.0)
MCHC: 34 g/dL (ref 30.0–36.0)
MCV: 90.2 fL (ref 78.0–100.0)
MCV: 89.9 fL (ref 78.0–100.0)
PLATELETS: 186 10*3/uL (ref 150–400)
Platelets: 194 10*3/uL (ref 150–400)
RBC: 4.07 MIL/uL (ref 3.87–5.11)
RBC: 4.15 MIL/uL (ref 3.87–5.11)
RDW: 14 % (ref 11.5–15.5)
RDW: 14 % (ref 11.5–15.5)
WBC: 7.7 10*3/uL (ref 4.0–10.5)
WBC: 9.2 10*3/uL (ref 4.0–10.5)

## 2015-06-28 SURGERY — Surgical Case
Anesthesia: Spinal | Site: Abdomen

## 2015-06-28 MED ORDER — LACTATED RINGERS IV BOLUS (SEPSIS)
1000.0000 mL | Freq: Once | INTRAVENOUS | Status: AC
  Administered 2015-06-28: 1000 mL via INTRAVENOUS

## 2015-06-28 MED ORDER — IBUPROFEN 600 MG PO TABS
600.0000 mg | ORAL_TABLET | Freq: Four times a day (QID) | ORAL | Status: DC
  Administered 2015-06-29 – 2015-06-30 (×8): 600 mg via ORAL
  Filled 2015-06-28 (×8): qty 1

## 2015-06-28 MED ORDER — METOCLOPRAMIDE HCL 5 MG/ML IJ SOLN: 10.0000 mg | Freq: Once | INTRAMUSCULAR | Status: DC | PRN

## 2015-06-28 MED ORDER — LACTATED RINGERS IV SOLN
INTRAVENOUS | Status: DC | PRN
  Administered 2015-06-28: 16:00:00 via INTRAVENOUS

## 2015-06-28 MED ORDER — LEVOTHYROXINE SODIUM 75 MCG PO TABS
75.0000 ug | ORAL_TABLET | Freq: Every day | ORAL | Status: DC
  Administered 2015-06-28 – 2015-06-29 (×2): 75 ug via ORAL
  Filled 2015-06-28 (×3): qty 1

## 2015-06-28 MED ORDER — FAMOTIDINE IN NACL 20-0.9 MG/50ML-% IV SOLN
20.0000 mg | Freq: Once | INTRAVENOUS | Status: AC
  Administered 2015-06-28: 20 mg via INTRAVENOUS
  Filled 2015-06-28: qty 50

## 2015-06-28 MED ORDER — OXYTOCIN 10 UNIT/ML IJ SOLN
40.0000 [IU] | INTRAVENOUS | Status: DC | PRN
  Administered 2015-06-28: 40 [IU] via INTRAVENOUS

## 2015-06-28 MED ORDER — FENTANYL CITRATE (PF) 100 MCG/2ML IJ SOLN
INTRAMUSCULAR | Status: AC
  Filled 2015-06-28: qty 2

## 2015-06-28 MED ORDER — DIPHENHYDRAMINE HCL 50 MG/ML IJ SOLN: 12.5000 mg | INTRAMUSCULAR | Status: DC | PRN

## 2015-06-28 MED ORDER — OXYCODONE-ACETAMINOPHEN 5-325 MG PO TABS
2.0000 | ORAL_TABLET | ORAL | Status: DC | PRN
  Administered 2015-06-30 (×2): 2 via ORAL
  Filled 2015-06-28: qty 2

## 2015-06-28 MED ORDER — SCOPOLAMINE 1 MG/3DAYS TD PT72
MEDICATED_PATCH | TRANSDERMAL | Status: AC
  Filled 2015-06-28: qty 1

## 2015-06-28 MED ORDER — IBUPROFEN 600 MG PO TABS: 600.0000 mg | ORAL_TABLET | Freq: Four times a day (QID) | ORAL | Status: DC | PRN

## 2015-06-28 MED ORDER — EPHEDRINE SULFATE 50 MG/ML IJ SOLN
INTRAMUSCULAR | Status: DC | PRN
  Administered 2015-06-28: 10 mg via INTRAVENOUS

## 2015-06-28 MED ORDER — CEFAZOLIN SODIUM-DEXTROSE 2-3 GM-% IV SOLR
INTRAVENOUS | Status: DC | PRN
  Administered 2015-06-28: 2 g via INTRAVENOUS

## 2015-06-28 MED ORDER — MORPHINE SULFATE (PF) 0.5 MG/ML IJ SOLN
INTRAMUSCULAR | Status: DC | PRN
  Administered 2015-06-28: .2 mg via INTRATHECAL

## 2015-06-28 MED ORDER — KETOROLAC TROMETHAMINE 30 MG/ML IJ SOLN: 30.0000 mg | Freq: Four times a day (QID) | INTRAMUSCULAR | Status: DC | PRN

## 2015-06-28 MED ORDER — MORPHINE SULFATE (PF) 0.5 MG/ML IJ SOLN: INTRAMUSCULAR | Status: DC | PRN

## 2015-06-28 MED ORDER — DIBUCAINE 1 % RE OINT: 1.0000 "application " | TOPICAL_OINTMENT | RECTAL | Status: DC | PRN

## 2015-06-28 MED ORDER — MEPERIDINE HCL 25 MG/ML IJ SOLN: 6.2500 mg | INTRAMUSCULAR | Status: DC | PRN

## 2015-06-28 MED ORDER — DEXAMETHASONE SODIUM PHOSPHATE 4 MG/ML IJ SOLN
INTRAMUSCULAR | Status: DC | PRN
  Administered 2015-06-28: 4 mg via INTRAVENOUS

## 2015-06-28 MED ORDER — SODIUM CHLORIDE 0.9% FLUSH: 3.0000 mL | INTRAVENOUS | Status: DC | PRN

## 2015-06-28 MED ORDER — KETOROLAC TROMETHAMINE 30 MG/ML IJ SOLN
30.0000 mg | Freq: Four times a day (QID) | INTRAMUSCULAR | Status: DC | PRN
  Administered 2015-06-28: 30 mg via INTRAMUSCULAR

## 2015-06-28 MED ORDER — BISACODYL 10 MG RE SUPP: 10.0000 mg | Freq: Every day | RECTAL | Status: DC | PRN

## 2015-06-28 MED ORDER — PANTOPRAZOLE SODIUM 40 MG PO TBEC
40.0000 mg | DELAYED_RELEASE_TABLET | Freq: Every day | ORAL | Status: DC
  Administered 2015-06-28 – 2015-06-30 (×3): 40 mg via ORAL
  Filled 2015-06-28 (×3): qty 1

## 2015-06-28 MED ORDER — PHENYLEPHRINE 8 MG IN D5W 100 ML (0.08MG/ML) PREMIX OPTIME
INJECTION | INTRAVENOUS | Status: AC
  Filled 2015-06-28: qty 100

## 2015-06-28 MED ORDER — NALBUPHINE HCL 10 MG/ML IJ SOLN: 5.0000 mg | Freq: Once | INTRAMUSCULAR | Status: DC | PRN

## 2015-06-28 MED ORDER — FENTANYL CITRATE (PF) 100 MCG/2ML IJ SOLN: 25.0000 ug | INTRAMUSCULAR | Status: DC | PRN

## 2015-06-28 MED ORDER — PHENYLEPHRINE 8 MG IN D5W 100 ML (0.08MG/ML) PREMIX OPTIME
INJECTION | INTRAVENOUS | Status: DC | PRN
  Administered 2015-06-28: 60 ug/min via INTRAVENOUS

## 2015-06-28 MED ORDER — DIPHENHYDRAMINE HCL 25 MG PO CAPS
25.0000 mg | ORAL_CAPSULE | ORAL | Status: DC | PRN
  Filled 2015-06-28: qty 1

## 2015-06-28 MED ORDER — SIMETHICONE 80 MG PO CHEW
80.0000 mg | CHEWABLE_TABLET | ORAL | Status: DC
  Administered 2015-06-29 (×2): 80 mg via ORAL
  Filled 2015-06-28 (×2): qty 1

## 2015-06-28 MED ORDER — FLEET ENEMA 7-19 GM/118ML RE ENEM
1.0000 | ENEMA | Freq: Every day | RECTAL | Status: DC | PRN
  Administered 2015-06-30: 1 via RECTAL
  Filled 2015-06-28: qty 1

## 2015-06-28 MED ORDER — CITRIC ACID-SODIUM CITRATE 334-500 MG/5ML PO SOLN
30.0000 mL | Freq: Once | ORAL | Status: AC
Start: 2015-06-28 — End: 2015-06-28
  Administered 2015-06-28: 30 mL via ORAL
  Filled 2015-06-28: qty 15

## 2015-06-28 MED ORDER — CEFAZOLIN SODIUM-DEXTROSE 2-4 GM/100ML-% IV SOLN
INTRAVENOUS | Status: AC
  Filled 2015-06-28: qty 100

## 2015-06-28 MED ORDER — OXYTOCIN 10 UNIT/ML IJ SOLN
INTRAMUSCULAR | Status: AC
  Filled 2015-06-28: qty 4

## 2015-06-28 MED ORDER — DIPHENHYDRAMINE HCL 25 MG PO CAPS
25.0000 mg | ORAL_CAPSULE | Freq: Four times a day (QID) | ORAL | Status: DC | PRN
  Administered 2015-06-29: 25 mg via ORAL
  Filled 2015-06-28: qty 1

## 2015-06-28 MED ORDER — SIMETHICONE 80 MG PO CHEW: 80.0000 mg | CHEWABLE_TABLET | ORAL | Status: DC | PRN

## 2015-06-28 MED ORDER — FERROUS SULFATE 325 (65 FE) MG PO TABS
325.0000 mg | ORAL_TABLET | Freq: Two times a day (BID) | ORAL | Status: DC
  Administered 2015-06-29 – 2015-06-30 (×4): 325 mg via ORAL
  Filled 2015-06-28 (×4): qty 1

## 2015-06-28 MED ORDER — DEXAMETHASONE SODIUM PHOSPHATE 4 MG/ML IJ SOLN
INTRAMUSCULAR | Status: AC
  Filled 2015-06-28: qty 1

## 2015-06-28 MED ORDER — FENTANYL CITRATE (PF) 100 MCG/2ML IJ SOLN
INTRAMUSCULAR | Status: DC | PRN
  Administered 2015-06-28: 20 ug via INTRATHECAL

## 2015-06-28 MED ORDER — LACTATED RINGERS IV SOLN
INTRAVENOUS | Status: DC
  Administered 2015-06-29: 125 mL/h via INTRAVENOUS

## 2015-06-28 MED ORDER — LACTATED RINGERS IV SOLN
INTRAVENOUS | Status: DC
Start: 2015-06-28 — End: 2015-06-28
  Administered 2015-06-28 (×4): via INTRAVENOUS

## 2015-06-28 MED ORDER — ONDANSETRON HCL 4 MG/2ML IJ SOLN
INTRAMUSCULAR | Status: AC
  Filled 2015-06-28: qty 2

## 2015-06-28 MED ORDER — EPHEDRINE 5 MG/ML INJ
INTRAVENOUS | Status: AC
Start: 2015-06-28 — End: 2015-06-28
  Filled 2015-06-28: qty 10

## 2015-06-28 MED ORDER — SIMETHICONE 80 MG PO CHEW
80.0000 mg | CHEWABLE_TABLET | Freq: Three times a day (TID) | ORAL | Status: DC
  Administered 2015-06-29 – 2015-06-30 (×6): 80 mg via ORAL
  Filled 2015-06-28 (×6): qty 1

## 2015-06-28 MED ORDER — NALOXONE HCL 2 MG/2ML IJ SOSY
1.0000 ug/kg/h | PREFILLED_SYRINGE | INTRAMUSCULAR | Status: DC | PRN
  Filled 2015-06-28: qty 2

## 2015-06-28 MED ORDER — LACTATED RINGERS IV SOLN: 2.5000 [IU]/h | INTRAVENOUS | Status: AC

## 2015-06-28 MED ORDER — PRENATAL MULTIVITAMIN CH
1.0000 | ORAL_TABLET | Freq: Every day | ORAL | Status: DC
  Administered 2015-06-29 (×2): 1 via ORAL
  Filled 2015-06-28 (×2): qty 1

## 2015-06-28 MED ORDER — OXYCODONE-ACETAMINOPHEN 5-325 MG PO TABS
1.0000 | ORAL_TABLET | ORAL | Status: DC | PRN
Start: 2015-06-28 — End: 2015-07-01
  Administered 2015-06-29 (×2): 1 via ORAL
  Filled 2015-06-28 (×4): qty 1

## 2015-06-28 MED ORDER — NALOXONE HCL 0.4 MG/ML IJ SOLN: 0.4000 mg | INTRAMUSCULAR | Status: DC | PRN

## 2015-06-28 MED ORDER — MENTHOL 3 MG MT LOZG: 1.0000 | LOZENGE | OROMUCOSAL | Status: DC | PRN

## 2015-06-28 MED ORDER — TETANUS-DIPHTH-ACELL PERTUSSIS 5-2.5-18.5 LF-MCG/0.5 IM SUSP: 0.5000 mL | Freq: Once | INTRAMUSCULAR | Status: DC

## 2015-06-28 MED ORDER — MEASLES, MUMPS & RUBELLA VAC ~~LOC~~ INJ: 0.5000 mL | INJECTION | Freq: Once | SUBCUTANEOUS | Status: DC

## 2015-06-28 MED ORDER — ZOLPIDEM TARTRATE 5 MG PO TABS: 5.0000 mg | ORAL_TABLET | Freq: Every evening | ORAL | Status: DC | PRN

## 2015-06-28 MED ORDER — ONDANSETRON HCL 4 MG/2ML IJ SOLN
INTRAMUSCULAR | Status: DC | PRN
  Administered 2015-06-28: 4 mg via INTRAVENOUS

## 2015-06-28 MED ORDER — MORPHINE SULFATE (PF) 0.5 MG/ML IJ SOLN
INTRAMUSCULAR | Status: AC
  Filled 2015-06-28: qty 10

## 2015-06-28 MED ORDER — ACETAMINOPHEN 325 MG PO TABS: 650.0000 mg | ORAL_TABLET | ORAL | Status: DC | PRN

## 2015-06-28 MED ORDER — FENTANYL CITRATE (PF) 100 MCG/2ML IJ SOLN: INTRAMUSCULAR | Status: DC | PRN

## 2015-06-28 MED ORDER — NALBUPHINE HCL 10 MG/ML IJ SOLN: 5.0000 mg | INTRAMUSCULAR | Status: DC | PRN

## 2015-06-28 MED ORDER — KETOROLAC TROMETHAMINE 30 MG/ML IJ SOLN
INTRAMUSCULAR | Status: AC
  Filled 2015-06-28: qty 1

## 2015-06-28 MED ORDER — WITCH HAZEL-GLYCERIN EX PADS: 1.0000 "application " | MEDICATED_PAD | CUTANEOUS | Status: DC | PRN

## 2015-06-28 MED ORDER — ONDANSETRON HCL 4 MG/2ML IJ SOLN: 4.0000 mg | Freq: Three times a day (TID) | INTRAMUSCULAR | Status: DC | PRN

## 2015-06-28 MED ORDER — BUPIVACAINE IN DEXTROSE 0.75-8.25 % IT SOLN
INTRATHECAL | Status: DC | PRN
  Administered 2015-06-28: 1.5 mL via INTRATHECAL

## 2015-06-28 MED ORDER — SENNOSIDES-DOCUSATE SODIUM 8.6-50 MG PO TABS
2.0000 | ORAL_TABLET | ORAL | Status: DC
  Administered 2015-06-29 (×2): 2 via ORAL
  Filled 2015-06-28 (×2): qty 2

## 2015-06-28 MED ORDER — SCOPOLAMINE 1 MG/3DAYS TD PT72
MEDICATED_PATCH | TRANSDERMAL | Status: DC | PRN
  Administered 2015-06-28: 1 via TRANSDERMAL

## 2015-06-28 MED ORDER — LANOLIN HYDROUS EX OINT: 1.0000 "application " | TOPICAL_OINTMENT | CUTANEOUS | Status: DC | PRN

## 2015-06-28 SURGICAL SUPPLY — 37 items
BENZOIN TINCTURE PRP APPL 2/3 (GAUZE/BANDAGES/DRESSINGS) ×3 IMPLANT
CHLORAPREP W/TINT 26ML (MISCELLANEOUS) ×3 IMPLANT
CLAMP CORD UMBIL (MISCELLANEOUS) IMPLANT
CLOSURE WOUND 1/2 X4 (GAUZE/BANDAGES/DRESSINGS) ×1
CLOTH BEACON ORANGE TIMEOUT ST (SAFETY) ×3 IMPLANT
CONTAINER PREFILL 10% NBF 15ML (MISCELLANEOUS) IMPLANT
DRAIN JACKSON PRT FLT 10 (DRAIN) IMPLANT
DRSG OPSITE POSTOP 4X10 (GAUZE/BANDAGES/DRESSINGS) ×3 IMPLANT
ELECT REM PT RETURN 9FT ADLT (ELECTROSURGICAL) ×3
ELECTRODE REM PT RTRN 9FT ADLT (ELECTROSURGICAL) ×1 IMPLANT
EVACUATOR SILICONE 100CC (DRAIN) IMPLANT
EXTRACTOR VACUUM M CUP 4 TUBE (SUCTIONS) IMPLANT
EXTRACTOR VACUUM M CUP 4' TUBE (SUCTIONS)
GLOVE BIO SURGEON STRL SZ 6.5 (GLOVE) ×2 IMPLANT
GLOVE BIO SURGEONS STRL SZ 6.5 (GLOVE) ×1
GLOVE BIOGEL PI IND STRL 7.0 (GLOVE) ×3 IMPLANT
GLOVE BIOGEL PI INDICATOR 7.0 (GLOVE) ×6
GOWN STRL REUS W/TWL LRG LVL3 (GOWN DISPOSABLE) ×6 IMPLANT
HEMOSTAT SURGICEL 2X3 (HEMOSTASIS) ×3 IMPLANT
KIT ABG SYR 3ML LUER SLIP (SYRINGE) IMPLANT
NEEDLE HYPO 25X5/8 SAFETYGLIDE (NEEDLE) IMPLANT
NS IRRIG 1000ML POUR BTL (IV SOLUTION) ×3 IMPLANT
PACK C SECTION WH (CUSTOM PROCEDURE TRAY) ×3 IMPLANT
PAD OB MATERNITY 4.3X12.25 (PERSONAL CARE ITEMS) ×3 IMPLANT
PENCIL SMOKE EVAC W/HOLSTER (ELECTROSURGICAL) ×3 IMPLANT
RTRCTR C-SECT PINK 25CM LRG (MISCELLANEOUS) IMPLANT
STRIP CLOSURE SKIN 1/2X4 (GAUZE/BANDAGES/DRESSINGS) ×2 IMPLANT
SUT CHROMIC 0 CT 1 (SUTURE) ×3 IMPLANT
SUT MNCRL AB 3-0 PS2 27 (SUTURE) ×3 IMPLANT
SUT PLAIN 2 0 (SUTURE) ×4
SUT PLAIN 2 0 XLH (SUTURE) ×3 IMPLANT
SUT PLAIN ABS 2-0 CT1 27XMFL (SUTURE) ×2 IMPLANT
SUT SILK 2 0 SH (SUTURE) IMPLANT
SUT VIC AB 0 CTX 36 (SUTURE) ×8
SUT VIC AB 0 CTX36XBRD ANBCTRL (SUTURE) ×4 IMPLANT
TOWEL OR 17X24 6PK STRL BLUE (TOWEL DISPOSABLE) ×3 IMPLANT
TRAY FOLEY CATH SILVER 14FR (SET/KITS/TRAYS/PACK) ×3 IMPLANT
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
## 2015-06-28 NOTE — MAU Note (Signed)
Contractions started around 0700 have gotten stronger and closer together.  Denies LOF/VB.

## 2015-06-28 NOTE — Anesthesia Postprocedure Evaluation (Signed)
Anesthesia Post Note  Patient: Denise Griffin  Procedure(s) Performed: Procedure(s) (LRB): CESAREAN SECTION (N/A)  Patient location during evaluation: PACU Anesthesia Type: Spinal and MAC Level of consciousness: awake and alert Pain management: pain level controlled Vital Signs Assessment: post-procedure vital signs reviewed and stable Respiratory status: spontaneous breathing and respiratory function stable Cardiovascular status: blood pressure returned to baseline and stable Postop Assessment: spinal receding Anesthetic complications: no    Last Vitals:  Filed Vitals:   06/28/15 1800 06/28/15 1809  BP: 110/85   Pulse: 81 95  Temp:  36.9 C  Resp: 17 23    Last Pain:  Filed Vitals:   06/28/15 1812  PainSc: 0-No pain                 Zackari Ruane DANIEL
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
## 2015-06-28 NOTE — Op Note (Signed)
Cesarean Section Procedure Note   Rihanna A [place]  06/28/2015  Indications: Scheduled Proceedure/Maternal Request and with painful contractions   Pre-operative Diagnosis: repeat in labor.   Post-operative Diagnosis: Same   Surgeon: Surgeon(s) and Role:    * Jaymes GraffNaima Keymari Sato, MD - Primary   Assistants: Alphonzo Severanceachel Stall CNM   Anesthesia: spinal   Procedure Details:  The patient was seen in the Holding Room. The risks, benefits, complications, treatment options, and expected outcomes were discussed with the patient. The patient concurred with the proposed plan, giving informed consent. identified as Julicia A [place] and the procedure verified as C-Section Delivery. A Time Out was held and the above information confirmed.  After induction of anesthesia, the patient was draped and prepped in the usual sterile manner. A transverse incision was made and carried down through the subcutaneous tissue to the fascia. Fascial incision was made in the midline and extended transversely. The fascia was separated from the underlying rectus muscle superiorly and inferiorly. The peritoneum was identified and entered. Peritoneal incision was extended longitudinally with good visualization of bowel and bladder. The utero-vesical peritoneal reflection was incised transversely and the bladder flap was bluntly freed from the lower uterine segment.  An alexsis retractor was placed in the abdomen.   A low transverse uterine incision was made. Delivered from cephalic presentation was a  infant, with Apgar scores of 8 at one minute and 9 at five minutes. Cord ph was not sent the umbilical cord was clamped and cut cord blood was obtained for evaluation. The placenta was removed Intact and appeared normal. The uterine outline, tubes and ovaries appeared normal}. The uterine incision was closed with running locked sutures of 0Vicryl. A second layer 0 vicrlyl was used to imbricate the uterine incision. Two figure eight sutures were  placed because of bleeding.both fallopian tubes were removed using kelly clamps and 3-0 vicryl suture.      Hemostasis was observed. Lavage was carried out until clear. The alexsis was removed.  The peritoneum was closed with 0 chromic.  The muscles were examined and any bleeders were made hemostatic using bovie cautery device.   The fascia was then reapproximated with running sutures of 0 vicryl.  The subcutaneous tissue was reapproximated  With interrupted stitches using 2-0 plain gut. The subcuticular closure was performed using 3-700monocryl     Instrument, sponge, and needle counts were correct prior the abdominal closure and were correct at the conclusion of the case.    Findings: infant was delivered from vtx presentation. The fluid was meconium stained.  The uterus tubes and ovaries appeared normal.     Estimated Blood Loss: 850 ml   Total IV Fluids: 3500ml   Urine Output: 400CC OF clear urine  Specimens: placenta to pathology  Complications: no complications  Disposition: PACU - hemodynamically stable.   Maternal Condition: stable   Baby condition / location:  Couplet care / Skin to Skin  Attending Attestation: I performed the procedure.   Signed: Surgeon(s): Jaymes GraffNaima Kirsten Mckone, MD

## 2015-06-28 NOTE — Anesthesia Procedure Notes (Addendum)
Spinal Patient location during procedure: OR Start time: 06/28/2015 3:40 PM Preanesthetic Checklist Completed: patient identified, site marked, surgical consent, pre-op evaluation, timeout performed, IV checked, risks and benefits discussed and monitors and equipment checked Spinal Block Patient position: sitting Prep: DuraPrep Patient monitoring: heart rate, cardiac monitor, continuous pulse ox and blood pressure Approach: midline Location: L3-4 Injection technique: single-shot Needle Needle type: Pencan  Needle gauge: 24 G Needle length: 9 cm Needle insertion depth: 7 cm Assessment Sensory level: T4 Additional Notes Patient tolerated procedure well. Adequate sensory level.

## 2015-06-28 NOTE — Transfer of Care (Signed)
Immediate Anesthesia Transfer of Care Note  Patient: Denise Griffin  Procedure(s) Performed: Procedure(s): CESAREAN SECTION (N/A)  Patient Location: PACU  Anesthesia Type:Spinal  Level of Consciousness: awake, alert  and oriented  Airway & Oxygen Therapy: Patient Spontanous Breathing and Patient connected to nasal cannula oxygen  Post-op Assessment: Report given to RN and Post -op Vital signs reviewed and stable  Post vital signs: Reviewed and stable  Last Vitals:  Filed Vitals:   06/28/15 1221 06/28/15 1720  BP: 118/66 104/72  Pulse: 112 110  Temp: 36.7 C 36.8 C  Resp: 16 26    Complications: No apparent anesthesia complications

## 2015-06-29 ENCOUNTER — Inpatient Hospital Stay (HOSPITAL_COMMUNITY)
Admission: RE | Admit: 2015-06-29 | Payer: Medicaid Other | Source: Ambulatory Visit | Admitting: Obstetrics and Gynecology

## 2015-06-29 ENCOUNTER — Encounter (HOSPITAL_COMMUNITY): Payer: Self-pay | Admitting: Obstetrics and Gynecology

## 2015-06-29 ENCOUNTER — Encounter (HOSPITAL_COMMUNITY): Admission: RE | Payer: Self-pay | Source: Ambulatory Visit

## 2015-06-29 LAB — CBC
HEMATOCRIT: 28.7 % — AB (ref 36.0–46.0)
Hemoglobin: 9.6 g/dL — ABNORMAL LOW (ref 12.0–15.0)
MCH: 30.1 pg (ref 26.0–34.0)
MCHC: 33.4 g/dL (ref 30.0–36.0)
MCV: 90 fL (ref 78.0–100.0)
PLATELETS: 181 10*3/uL (ref 150–400)
RBC: 3.19 MIL/uL — ABNORMAL LOW (ref 3.87–5.11)
RDW: 14.2 % (ref 11.5–15.5)
WBC: 8.2 10*3/uL (ref 4.0–10.5)

## 2015-06-29 LAB — RPR
RPR Ser Ql: NONREACTIVE
RPR Ser Ql: NONREACTIVE

## 2015-06-29 LAB — TSH: TSH: 2.317 u[IU]/mL (ref 0.350–4.500)

## 2015-06-29 SURGERY — Surgical Case
Anesthesia: Regional

## 2015-06-29 NOTE — Progress Notes (Addendum)
Denise Griffin [id number]  Subjective: Postpartum Day 1: Repeat C/S  due to labor and Salpingectomy  Patient up ad lib, reports no syncope or dizziness.  Unable to void, RN reports straight cath with 1000 ml output Feeding:  breastfeeding Contraceptive plan:  BTL  Objective: Temp:  [98.2 F (36.8 C)-98.7 F (37.1 C)] 98.7 F (37.1 C) (04/05 0530) Pulse Rate:  [65-110] 69 (04/05 0535) Resp:  [16-27] 18 (04/05 0530) BP: (87-120)/(40-86) 99/57 mmHg (04/05 0540) SpO2:  [95 %-98 %] 98 % (04/05 0530)  CBC Latest Ref Rng 06/29/2015 06/28/2015 06/28/2015  WBC 4.0 - 10.5 K/uL 8.2 9.2 7.7  Hemoglobin 12.0 - 15.0 g/dL 4.0(J9.6(L) 81.112.7 91.412.4  Hematocrit 36.0 - 46.0 % 28.7(L) 37.3 36.7  Platelets 150 - 400 K/uL 181 186 194   Results for orders placed or performed during the hospital encounter of 06/28/15 (from the past 24 hour(s))  CBC     Status: None   Collection Time: 06/28/15  1:47 PM  Result Value Ref Range   WBC 9.2 4.0 - 10.5 K/uL   RBC 4.15 3.87 - 5.11 MIL/uL   Hemoglobin 12.7 12.0 - 15.0 g/dL   HCT 78.237.3 95.636.0 - 21.346.0 %   MCV 89.9 78.0 - 100.0 fL   MCH 30.6 26.0 - 34.0 pg   MCHC 34.0 30.0 - 36.0 g/dL   RDW 08.614.0 57.811.5 - 46.915.5 %   Platelets 186 150 - 400 K/uL  RPR     Status: None   Collection Time: 06/28/15  1:47 PM  Result Value Ref Range   RPR Ser Ql Non Reactive Non Reactive  CBC     Status: Abnormal   Collection Time: 06/29/15  6:01 AM  Result Value Ref Range   WBC 8.2 4.0 - 10.5 K/uL   RBC 3.19 (L) 3.87 - 5.11 MIL/uL   Hemoglobin 9.6 (L) 12.0 - 15.0 g/dL   HCT 62.928.7 (L) 52.836.0 - 41.346.0 %   MCV 90.0 78.0 - 100.0 fL   MCH 30.1 26.0 - 34.0 pg   MCHC 33.4 30.0 - 36.0 g/dL   RDW 24.414.2 01.011.5 - 27.215.5 %   Platelets 181 150 - 400 K/uL  TSH     Status: None   Collection Time: 06/29/15  6:27 AM  Result Value Ref Range   TSH 2.317 0.350 - 4.500 uIU/mL   . ferrous sulfate  325 mg Oral BID WC  . ibuprofen  600 mg Oral 4 times per day  . levothyroxine  75 mcg Oral QAC breakfast  . measles,  mumps and rubella vaccine  0.5 mL Subcutaneous Once  . pantoprazole  40 mg Oral Daily  . prenatal multivitamin  1 tablet Oral Q1200  . senna-docusate  2 tablet Oral Q24H  . simethicone  80 mg Oral TID PC  . simethicone  80 mg Oral Q24H  . Tdap  0.5 mL Intramuscular Once     Physical Exam:  General: alert and cooperative Lochia: appropriate Uterine Fundus: firm Abdomen:  + bowel sounds, NT Incision: Honeycomb dressing CDI DVT Evaluation: No evidence of DVT seen on physical exam. JP drain:   none  Assessment/Plan: Status post cesarean delivery, day 1. Strict I/O, observe ability to urinate Screened out of CSW referral, no follow up needed per CSW Overall Stable Continue current care. Breastfeeding and Lactation consult    Alphonzo SeveranceRachel Shaindy Reader MSN, CNM 06/29/2015, 12:54 PM   Addendum:  After 6 hours if patient has not voided, insert foley catheter and obtain a urine  culture   Alphonzo Severance MSN, CNM 06/29/15 1530

## 2015-06-29 NOTE — Progress Notes (Signed)
Pt voided 325 cc of clear light yellow urine.  R Stall,CNM notified and clean catch urine sent to lab

## 2015-06-29 NOTE — Progress Notes (Signed)
UR chart review completed.  

## 2015-06-29 NOTE — Progress Notes (Signed)
Pt states she takes her synthroid and prenatal vitamin at night. Patient states she did not receive her prenatal vitamin today and wants to take it tonight

## 2015-06-29 NOTE — Addendum Note (Signed)
Addendum  created 06/29/15 0810 by Elbert Ewingsolleen S Shona Pardo, CRNA   Modules edited: Clinical Notes   Clinical Notes:  File: [id number]
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
## 2015-06-29 NOTE — Anesthesia Postprocedure Evaluation (Signed)
Anesthesia Post Note  Patient: Denise Griffin  Procedure(s) Performed: Procedure(s) (LRB): CESAREAN SECTION (N/A)  Patient location during evaluation: Mother Baby Anesthesia Type: Spinal Level of consciousness: awake, awake and alert and oriented Pain management: pain level controlled Vital Signs Assessment: post-procedure vital signs reviewed and stable Respiratory status: spontaneous breathing, nonlabored ventilation and respiratory function stable Cardiovascular status: stable Postop Assessment: no headache, no backache, patient able to bend at knees, no signs of nausea or vomiting and adequate PO intake Anesthetic complications: no    Last Vitals:  Filed Vitals:   06/29/15 0535 06/29/15 0540  BP: 98/52 99/57  Pulse: 69   Temp:    Resp:      Last Pain:  Filed Vitals:   06/29/15 0628  PainSc: 4                  Melena Hayes

## 2015-06-30 LAB — CULTURE, OB URINE: SPECIAL REQUESTS: NORMAL

## 2015-06-30 MED ORDER — OXYCODONE-ACETAMINOPHEN 5-325 MG PO TABS: 1.0000 | ORAL_TABLET | ORAL | Status: DC | PRN

## 2015-06-30 MED ORDER — IBUPROFEN 600 MG PO TABS: 600.0000 mg | ORAL_TABLET | Freq: Four times a day (QID) | ORAL | Status: DC

## 2015-06-30 NOTE — Lactation Note (Signed)
This note was copied from a baby's chart. Lactation Consultation Note  Patient Name: Boy Dynasty [place] Today's Date: 06/30/2015 Reason for consult: Follow-up assessment   With this mom of a term baby, now 1946 hours old, and full term. Mom recently diagnosed with PCOS, and needed clomid this pregnancy to conceive. Mom is worried about her milk supply. Her other child is 28 years old, and she reports she had a great milk supply with her. This baby was cluster feeding through the night, and his last full feed was for 20 minutes. Mom has easily expressed colostrum. I explained that her mature milk will begin transitioning in over night, that her baby is doing well, WNL of wet and lots of dirty diapers. Mom seemed reassured that at this time, breastfeeding is going well. I set up a DEP, thinking initially that mom needed to supplement the baby, but he is no longer on and off the breast, and feeding well. Mom knows to call for questions/concerns.    Maternal Data    Feeding Feeding Type: Breast Fed Length of feed: 20 min (20)  LATCH Score/Interventions Latch: Grasps breast easily, tongue down, lips flanged, rhythmical sucking.  Audible Swallowing: A few with stimulation  Type of Nipple: Everted at rest and after stimulation  Comfort (Breast/Nipple): Filling, red/small blisters or bruises, mild/mod discomfort  Problem noted: Mild/Moderate discomfort Interventions (Mild/moderate discomfort): Comfort gels Interventions (Severe discomfort): Double electric pum  Hold (Positioning): Assistance needed to correctly position infant at breast and maintain latch.  LATCH Score: 7  Lactation Tools Discussed/Used     Consult Status Consult Status: Follow-up Date: 07/01/15 Follow-up type: In-patient    Alfred LevinsLee, Ishanvi Mcquitty Anne 06/30/2015, 2:45 PM

## 2015-06-30 NOTE — Lactation Note (Addendum)
This note was copied from a baby's chart. Lactation Consultation Note Mom exhausted d/t baby hasn't slept d/t cluster feeding. Mom worried about baby not getting enough. Explained newborn behavior and cluster feeding. Discussed I&O. Baby has had good I&O. Nipples tender. Comfort gels given. Baby getting frustrated at times when pops off, arches and cries. Settles and BF well. Frustrated mom when baby does this. Explain to mom reasoning behind behavior.  Patient Name: Denise Griffin Today's Date: 06/30/2015 Reason for consult: Follow-up assessment;Breast/nipple pain   Maternal Data    Feeding Feeding Type: Breast Fed Length of feed: 30 min  LATCH Score/Interventions Latch: Repeated attempts needed to sustain latch, nipple held in mouth throughout feeding, stimulation needed to elicit sucking reflex. Intervention(s): Adjust position;Breast massage  Audible Swallowing: A few with stimulation Intervention(s): Skin to skin;Hand expression;Alternate breast massage  Type of Nipple: Everted at rest and after stimulation  Comfort (Breast/Nipple): Filling, red/small blisters or bruises, mild/mod discomfort  Problem noted: Mild/Moderate discomfort Interventions (Mild/moderate discomfort): Comfort gels;Hand massage;Hand expression  Hold (Positioning): Assistance needed to correctly position infant at breast and maintain latch. Intervention(s): Skin to skin;Position options;Support Pillows;Breastfeeding basics reviewed  LATCH Score: 6  Lactation Tools Discussed/Used Tools: Comfort gels   Consult Status Consult Status: Follow-up Date: 06/30/15 (in pm) Follow-up type: In-patient    Tanor Glaspy, Diamond NickelLAURA G 06/30/2015, 5:09 AM
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
## 2015-06-30 NOTE — Discharge Summary (Signed)
  Obstetric Discharge Summary  Reason for Admission: onset of labor on 06/28/15.Was scheduled for a repeat cesarean section on 06/29/15. Prenatal Procedures: none Intrapartum Procedures: cesarean: low cervical, transverse by dr Normand Sloopillard on 06/28/15 with bilateral salpyngectomy Postpartum Procedures: none Complications-Operative and Postpartum: none  HEMOGLOBIN  Date Value Ref Range Status  06/29/2015 9.6* 12.0 - 15.0 g/dL Final    Comment:    DELTA CHECK NOTED REPEATED TO VERIFY    HCT  Date Value Ref Range Status  06/29/2015 28.7* 36.0 - 46.0 % Final    Discharge Diagnoses: Term Pregnancy-delivered  Hospital course: unremarkable. Patient discharged on 06/30/15   Date: 06/30/2015 Activity: unrestricted Diet: routine Medications: Ibuprofen and Percocet Condition: stable  Breastfeeding:   Yes.   Contraception:  Postpartum Sterilization  Instructions: refer to practice specific booklet Discharge to: home   Newborn Data:   Baby female  [facility] LLCRIVARD,Kveon Casanas A MD 06/30/2015, 6:54 PM

## 2015-06-30 NOTE — Discharge Instructions (Signed)
Cesarean Delivery Cesarean delivery is the birth of a baby through a cut (incision) in the abdomen and womb (uterus).  LET YOUR HEALTH CARE PROVIDER KNOW ABOUT:  All medicines you are taking, including vitamins, herbs, eye drops, creams, and over-the-counter medicines.  Previous problems you or members of your family have had with the use of anesthetics.  Any bleeding or blood clotting disorders you have.  Family history of blood clots or bleeding disorders.  Any history of deep vein thrombosis (DVT) or pulmonary embolism (PE).  Previous surgeries you have had.  Medical conditions you have.  Any allergies you have.  Complicationsinvolving the pregnancy. RISKS AND COMPLICATIONS  Generally, this is a safe procedure. However, as with any procedure, complications can occur. Possible complications include:  Bleeding.  Infection.  Blood clots.  Injury to surrounding organs.  Problems with anesthesia.  Injury to the baby. BEFORE THE PROCEDURE   You may be given an antacid medicine to drink. This will prevent acid contents in your stomach from going into your lungs if you vomit during the surgery.  You may be given an antibiotic medicine to prevent infection. PROCEDURE   To prevent infection of your incision:  Hair may be removed from your pubic area if it is near your incision.  The skin of your pubic area and lower abdomen will be cleaned with a germ-killing solution (antiseptic).  A tube (Foley catheter) will be placed in your bladder to drain your urine from your bladder into a bag. This keeps your bladder empty during surgery.  An IV tube will be placed in your vein.  You may be given medicine to numb the lower half of your body (regional anesthetic). If you were in labor, you may have already had an epidural in place which can be used in both labor and cesarean delivery. You may possibly be given medicine to make you sleep (general anesthetic) though this is not as  common.  Your heart rate and your baby's heart rate will be monitored.  An incision will be made in your abdomen that extends to your uterus. There are 2 basic kinds of incisions:  The horizontal (transverse) incision. Horizontal incisions are from side to side and are used for most routine cesarean deliveries.  The vertical incision. The vertical incision is from the top of the abdomen to the bottom and is less commonly used. It is often done for women who have a serious complication (extreme prematurity) or under emergency situations.  The horizontal and vertical incisions may both be used at the same time. However, this is very uncommon.  An incision is then made in your uterus to deliver the baby.  Your baby will be delivered.  Your health care provider may place the baby on your chest. It is important to keep the baby warm. Your health care provider will dry off the baby, place the baby directly on your bare skin, and cover the baby with warm, dry blankets.  Both incisions will be closed with absorbable stitches. AFTER THE PROCEDURE   If you were awake during the surgery, you will see your baby right away. If you were asleep, you will see your baby as soon as you are awake.  You may breastfeed your baby after surgery.  You may be able to get up and walk the same day as the surgery. If you need to stay in bed for a period of time, you will receive help to turn, cough, and take deep breaths after   surgery. This helps prevent lung problems such as pneumonia.  Do not get out of bed alone the first time after surgery. You will need help getting out of bed until you are able to do this by yourself.  You may be able to shower the day after your cesarean delivery. After the bandage (dressing) is taken off the incision site, a nurse will assist you to shower if you would like help.  You may be directed to take actions to help prevent blood clots in your legs. These may  include:  Walking shortly after surgery, with someone assisting you. Moving around after surgery helps to improve blood flow.  Wearing compression stockings or using different types of devices.  Taking medicines to thin your blood (anticoagulants) if you are at high risk for DVT or PE.  Save any blood clots that you pass from your vagina. If you pass a clot while on the toilet, do not flush it. Call for the nurse. Tell the nurse if you think you are bleeding too much or passing too many clots.  You will be given medicine for pain and nausea as needed. Let your health care providers know if you are hurting. You may also be given an antibiotic to prevent an infection.  Your IV tube will be taken out when you are drinking a reasonable amount of fluids. The Foley catheter is taken out when you are up and walking.  If your blood type is Rh negative and your baby's blood type is Rh positive, you will be given a shot of anti-D immune globulin. This shot prevents you from having Rh problems with a future pregnancy. You should get the shot even if you had your tubes tied (tubal ligation).  If you are allowed to take the baby for a walk, place the baby in the bassinet and push it.   This information is not intended to replace advice given to you by your health care provider. Make sure you discuss any questions you have with your health care provider.   Document Released: 03/12/2005 Document Revised: 12/01/2014 Document Reviewed: 11/07/2011 Elsevier Interactive Patient Education 2016 [employer]  

## 2015-07-06 ENCOUNTER — Ambulatory Visit (HOSPITAL_COMMUNITY)

## 2015-07-06 ENCOUNTER — Ambulatory Visit (HOSPITAL_COMMUNITY)
Admission: RE | Admit: 2015-07-06 | Discharge: 2015-07-06 | Disposition: A | Discharge status: Home / Self Care | Payer: Medicaid Other | Source: Ambulatory Visit | Attending: Obstetrics and Gynecology | Admitting: Obstetrics and Gynecology

## 2015-07-06 ENCOUNTER — Other Ambulatory Visit (HOSPITAL_COMMUNITY): Payer: Self-pay | Admitting: Obstetrics and Gynecology

## 2015-07-06 DIAGNOSIS — R300 Dysuria: Secondary | ICD-10-CM

## 2015-07-06 DIAGNOSIS — N200 Calculus of kidney: Secondary | ICD-10-CM | POA: Diagnosis not present

## 2015-07-06 DIAGNOSIS — R339 Retention of urine, unspecified: Secondary | ICD-10-CM

## 2015-07-06 DIAGNOSIS — N281 Cyst of kidney, acquired: Secondary | ICD-10-CM | POA: Diagnosis not present

## 2015-07-06 DIAGNOSIS — R9349 Abnormal radiologic findings on diagnostic imaging of other urinary organs: Secondary | ICD-10-CM | POA: Diagnosis not present

## 2015-07-06 MED ORDER — IOPAMIDOL (ISOVUE-300) INJECTION 61%
100.0000 mL | Freq: Once | INTRAVENOUS | Status: AC | PRN
  Administered 2015-07-06: 100 mL via INTRAVENOUS

## 2015-07-08 ENCOUNTER — Encounter (HOSPITAL_BASED_OUTPATIENT_CLINIC_OR_DEPARTMENT_OTHER): Payer: Self-pay

## 2015-07-08 ENCOUNTER — Emergency Department (HOSPITAL_BASED_OUTPATIENT_CLINIC_OR_DEPARTMENT_OTHER)
Admission: EM | Admit: 2015-07-08 | Discharge: 2015-07-08 | Disposition: A | Discharge status: Home / Self Care | Payer: Medicaid Other | Attending: Emergency Medicine | Admitting: Emergency Medicine

## 2015-07-08 DIAGNOSIS — E039 Hypothyroidism, unspecified: Secondary | ICD-10-CM | POA: Diagnosis not present

## 2015-07-08 DIAGNOSIS — O900 Disruption of cesarean delivery wound: Secondary | ICD-10-CM | POA: Diagnosis present

## 2015-07-08 DIAGNOSIS — O99285 Endocrine, nutritional and metabolic diseases complicating the puerperium: Secondary | ICD-10-CM | POA: Diagnosis not present

## 2015-07-08 DIAGNOSIS — Z79899 Other long term (current) drug therapy: Secondary | ICD-10-CM | POA: Insufficient documentation

## 2015-07-08 DIAGNOSIS — Z8619 Personal history of other infectious and parasitic diseases: Secondary | ICD-10-CM | POA: Insufficient documentation

## 2015-07-08 DIAGNOSIS — Z91040 Latex allergy status: Secondary | ICD-10-CM | POA: Insufficient documentation

## 2015-07-08 DIAGNOSIS — Z8739 Personal history of other diseases of the musculoskeletal system and connective tissue: Secondary | ICD-10-CM | POA: Diagnosis not present

## 2015-07-08 DIAGNOSIS — R39198 Other difficulties with micturition: Secondary | ICD-10-CM | POA: Diagnosis not present

## 2015-07-08 DIAGNOSIS — O9089 Other complications of the puerperium, not elsewhere classified: Secondary | ICD-10-CM | POA: Diagnosis not present

## 2015-07-08 DIAGNOSIS — T8131XA Disruption of external operation (surgical) wound, not elsewhere classified, initial encounter: Secondary | ICD-10-CM

## 2015-07-08 MED ORDER — CEPHALEXIN 500 MG PO CAPS: 500.0000 mg | ORAL_CAPSULE | Freq: Four times a day (QID) | ORAL | Status: DC

## 2015-07-08 MED ORDER — OXYCODONE-ACETAMINOPHEN 5-325 MG PO TABS: 1.0000 | ORAL_TABLET | Freq: Three times a day (TID) | ORAL | Status: DC | PRN

## 2015-07-08 MED FILL — CEPHALEXIN 500 MG CAPSULE: 500 | 5 days supply | Qty: 20 | Fill #0

## 2015-07-08 MED FILL — OXYCODONE/APAP 5-325: 5-325 | 4 days supply | Qty: 10 | Fill #0

## 2015-07-08 NOTE — Discharge Instructions (Signed)
Keep the wound clean and dry.  Change your dressing 3-4 times daily.  Get rechecked immediately if you develop fevers, significant redness, drainage, or pain.     Wound Dehiscence Wound dehiscence is when a surgical cut (incision) breaks open and does not heal properly after surgery. It usually happens 7-10 days after surgery. This can be a serious condition. It is important to identify and treat this condition early.  CAUSES  Some common causes of wound dehiscence include:  Stretching of the wound area. This may be caused by lifting, vomiting, violent coughing, or straining during bowel movements.  Wound infection.  Early stitch (suture) removal. RISK FACTORS Various things can increase your risk of developing wound dehiscence, including:  Obesity.  Lung disease.  Smoking.  Poor nutrition.  Contamination during surgery. SIGNS AND SYMPTOMS  Bleeding from the wound.  Pain.  Fever.  Wound starts breaking open. DIAGNOSIS  Your health care provider may diagnose wound dehiscence by monitoring the incision and noting any changes in the wound. These changes can include an increase in drainage or pain. The health care provider may also ask you if you have noticed any stretching or tearing of the wound.  Wound cultures may be taken to determine if there is an infection.  Imaging studies, such as an MRI scan or CT scan, may be done to determine if there is a collection of pus or fluid in the wound area. TREATMENT Treatment may include:  Wound care.  Surgical repair.  Antibiotic medicine to treat or prevent infection.  Medicines to reduce pain and swelling. HOME CARE INSTRUCTIONS   Only take over-the-counter or prescription medicines for pain, discomfort, or fever as directed by your health care provider. Taking pain medicine 30 minutes before changing a bandage (dressing) can help relieve pain.  Take your antibiotics as directed. Finish them even if you start to feel  better.  Gently wash the area with mild soap and water 2 times a day, or as directed. Rinse off the soap. Pat the area dry with a clean towel. Do not rub the wound. This may cause bleeding.  Follow your health care provider's instructions for how often you need to change the dressing and packing inside. Wash your hands well before and after changing your dressing. Apply a dressing to the wound as directed.  Take showers. Do not soak the wound, bathe, swim, or use a hot tub until directed by your health care provider.  Avoid exercises that make you sweat heavily.  Use anti-itch medicine as directed by your health care provider. The wound may itch when it is healing. Do not pick or scratch at the wound.  Do not lift more than 10 pounds (4.5 kg) until the wound is healed, or as directed by your health care provider.  Keep all follow-up appointments as directed. SEEK MEDICAL CARE IF:  You have excessive bleeding from your surgical wound.  Your wound does not seem to be healing properly.  You have a fever. SEEK IMMEDIATE MEDICAL CARE IF:   You have increased swelling or redness around the wound.  You have increasing pain in the wound.  You have an increasing amount of pus coming from the wound.  Your wound breaks open farther. MAKE SURE YOU:   Understand these instructions.  Will watch your condition.  Will get help right away if you are not doing well or get worse.   This information is not intended to replace advice given to you by your health  care provider. Make sure you discuss any questions you have with your health care provider.   Document Released: 06/02/2003 Document Revised: 03/17/2013 Document Reviewed: 11/17/2012 Elsevier Interactive Patient Education [employer]

## 2015-07-08 NOTE — ED Notes (Signed)
C/o drainage from Csection incision x 2 days (csection 4/4)-spoke to after hours OB nurse last night and was advised to remove steri strips-pt did and now having increase drainage and pain
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
## 2016-07-28 ENCOUNTER — Emergency Department (HOSPITAL_BASED_OUTPATIENT_CLINIC_OR_DEPARTMENT_OTHER): Payer: Medicaid Other

## 2016-07-28 ENCOUNTER — Encounter (HOSPITAL_BASED_OUTPATIENT_CLINIC_OR_DEPARTMENT_OTHER): Payer: Self-pay | Admitting: Emergency Medicine

## 2016-07-28 ENCOUNTER — Emergency Department (HOSPITAL_BASED_OUTPATIENT_CLINIC_OR_DEPARTMENT_OTHER)
Admission: EM | Admit: 2016-07-28 | Discharge: 2016-07-28 | Disposition: A | Discharge status: Home / Self Care | Payer: Medicaid Other | Attending: Emergency Medicine | Admitting: Emergency Medicine

## 2016-07-28 DIAGNOSIS — M25571 Pain in right ankle and joints of right foot: Secondary | ICD-10-CM

## 2016-07-28 DIAGNOSIS — Y998 Other external cause status: Secondary | ICD-10-CM | POA: Diagnosis not present

## 2016-07-28 DIAGNOSIS — F17210 Nicotine dependence, cigarettes, uncomplicated: Secondary | ICD-10-CM | POA: Insufficient documentation

## 2016-07-28 DIAGNOSIS — R609 Edema, unspecified: Secondary | ICD-10-CM | POA: Insufficient documentation

## 2016-07-28 DIAGNOSIS — E039 Hypothyroidism, unspecified: Secondary | ICD-10-CM | POA: Insufficient documentation

## 2016-07-28 DIAGNOSIS — Z7984 Long term (current) use of oral hypoglycemic drugs: Secondary | ICD-10-CM | POA: Diagnosis not present

## 2016-07-28 DIAGNOSIS — I10 Essential (primary) hypertension: Secondary | ICD-10-CM | POA: Diagnosis not present

## 2016-07-28 DIAGNOSIS — Z79899 Other long term (current) drug therapy: Secondary | ICD-10-CM | POA: Insufficient documentation

## 2016-07-28 DIAGNOSIS — X500XXA Overexertion from strenuous movement or load, initial encounter: Secondary | ICD-10-CM | POA: Insufficient documentation

## 2016-07-28 DIAGNOSIS — Y9339 Activity, other involving climbing, rappelling and jumping off: Secondary | ICD-10-CM | POA: Insufficient documentation

## 2016-07-28 DIAGNOSIS — Y929 Unspecified place or not applicable: Secondary | ICD-10-CM | POA: Diagnosis not present

## 2016-07-28 DIAGNOSIS — S99911A Unspecified injury of right ankle, initial encounter: Secondary | ICD-10-CM | POA: Diagnosis present

## 2016-07-28 MED ORDER — ACETAMINOPHEN 325 MG PO TABS
650.0000 mg | ORAL_TABLET | Freq: Once | ORAL | Status: AC
  Administered 2016-07-28: 650 mg via ORAL
  Filled 2016-07-28: qty 2

## 2016-07-28 NOTE — ED Triage Notes (Signed)
Pt c/o RT ankle pain after jumping to kill a bee yesterday

## 2016-07-28 NOTE — ED Notes (Signed)
Pt in wheelchair. 

## 2016-07-28 NOTE — Discharge Instructions (Signed)
Please read instructions below.  You can take tylenol or advil as needed for pain. Apply ice to your ankle for 20 minutes at a time and keep it elevated as much as possible.  Weight bear as tolerated.  Schedule an appoint with orthopedics for follow up. Return to the ER if numbness doesn't improve, or for new or worsening symptoms.
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
## 2016-07-28 NOTE — ED Notes (Signed)
Pt refused crutches. States she has a one year old to chase after.

## 2016-07-28 NOTE — ED Notes (Signed)
Patient transported to X-ray 

## 2016-08-18 ENCOUNTER — Encounter (HOSPITAL_BASED_OUTPATIENT_CLINIC_OR_DEPARTMENT_OTHER): Payer: Self-pay | Admitting: Emergency Medicine

## 2016-08-18 ENCOUNTER — Inpatient Hospital Stay (HOSPITAL_BASED_OUTPATIENT_CLINIC_OR_DEPARTMENT_OTHER)
Admission: EM | Admit: 2016-08-18 | Discharge: 2016-08-18 | Disposition: A | Discharge status: Home / Self Care | Payer: Medicaid Other | Attending: Obstetrics and Gynecology | Admitting: Obstetrics and Gynecology

## 2016-08-18 ENCOUNTER — Emergency Department (HOSPITAL_BASED_OUTPATIENT_CLINIC_OR_DEPARTMENT_OTHER): Payer: Medicaid Other

## 2016-08-18 ENCOUNTER — Other Ambulatory Visit: Payer: Self-pay | Admitting: Obstetrics and Gynecology

## 2016-08-18 DIAGNOSIS — Z79899 Other long term (current) drug therapy: Secondary | ICD-10-CM | POA: Insufficient documentation

## 2016-08-18 DIAGNOSIS — Z888 Allergy status to other drugs, medicaments and biological substances status: Secondary | ICD-10-CM | POA: Diagnosis not present

## 2016-08-18 DIAGNOSIS — E039 Hypothyroidism, unspecified: Secondary | ICD-10-CM | POA: Insufficient documentation

## 2016-08-18 DIAGNOSIS — R1031 Right lower quadrant pain: Secondary | ICD-10-CM | POA: Diagnosis not present

## 2016-08-18 DIAGNOSIS — E282 Polycystic ovarian syndrome: Secondary | ICD-10-CM | POA: Insufficient documentation

## 2016-08-18 DIAGNOSIS — Z9889 Other specified postprocedural states: Secondary | ICD-10-CM | POA: Diagnosis not present

## 2016-08-18 DIAGNOSIS — F17210 Nicotine dependence, cigarettes, uncomplicated: Secondary | ICD-10-CM | POA: Diagnosis not present

## 2016-08-18 DIAGNOSIS — N939 Abnormal uterine and vaginal bleeding, unspecified: Secondary | ICD-10-CM | POA: Diagnosis present

## 2016-08-18 DIAGNOSIS — N329 Bladder disorder, unspecified: Secondary | ICD-10-CM | POA: Diagnosis not present

## 2016-08-18 HISTORY — DX: Personal history of other diseases of the female genital tract: Z87.42

## 2016-08-18 LAB — CBC WITH DIFFERENTIAL/PLATELET
Basophils Absolute: 0 10*3/uL (ref 0.0–0.1)
Basophils Relative: 1 %
EOS ABS: 0.1 10*3/uL (ref 0.0–0.7)
EOS PCT: 2 %
HCT: 39 % (ref 36.0–46.0)
Hemoglobin: 13.5 g/dL (ref 12.0–15.0)
LYMPHS ABS: 1.5 10*3/uL (ref 0.7–4.0)
LYMPHS PCT: 34 %
MCH: 31.7 pg (ref 26.0–34.0)
MCHC: 34.6 g/dL (ref 30.0–36.0)
MCV: 91.5 fL (ref 78.0–100.0)
MONO ABS: 0.4 10*3/uL (ref 0.1–1.0)
Monocytes Relative: 10 %
Neutro Abs: 2.3 10*3/uL (ref 1.7–7.7)
Neutrophils Relative %: 53 %
PLATELETS: 213 10*3/uL (ref 150–400)
RBC: 4.26 MIL/uL (ref 3.87–5.11)
RDW: 13.1 % (ref 11.5–15.5)
WBC: 4.3 10*3/uL (ref 4.0–10.5)

## 2016-08-18 LAB — BASIC METABOLIC PANEL
Anion gap: 9 (ref 5–15)
BUN: 11 mg/dL (ref 6–20)
CHLORIDE: 104 mmol/L (ref 101–111)
CO2: 23 mmol/L (ref 22–32)
CREATININE: 0.79 mg/dL (ref 0.44–1.00)
Calcium: 9.2 mg/dL (ref 8.9–10.3)
GFR calc Af Amer: >60 mL/min (ref >60)
GLUCOSE: 107 mg/dL — AB (ref 65–99)
POTASSIUM: 3.8 mmol/L (ref 3.5–5.1)
SODIUM: 136 mmol/L (ref 135–145)

## 2016-08-18 LAB — URINALYSIS, ROUTINE W REFLEX MICROSCOPIC
Bilirubin Urine: NEGATIVE
Glucose, UA: NEGATIVE mg/dL
Ketones, ur: NEGATIVE mg/dL
NITRITE: NEGATIVE
PROTEIN: NEGATIVE mg/dL
Specific Gravity, Urine: 1.006 (ref 1.005–1.030)
pH: 7 (ref 5.0–8.0)

## 2016-08-18 LAB — TYPE AND SCREEN
ABO/RH(D): O POS
ANTIBODY SCREEN: NEGATIVE

## 2016-08-18 LAB — CBC
HEMATOCRIT: 36.4 % (ref 36.0–46.0)
Hemoglobin: 12.3 g/dL (ref 12.0–15.0)
MCH: 31.1 pg (ref 26.0–34.0)
MCHC: 33.8 g/dL (ref 30.0–36.0)
MCV: 92.2 fL (ref 78.0–100.0)
Platelets: 213 10*3/uL (ref 150–400)
RBC: 3.95 MIL/uL (ref 3.87–5.11)
RDW: 13.6 % (ref 11.5–15.5)
WBC: 4.5 10*3/uL (ref 4.0–10.5)

## 2016-08-18 LAB — HEMOGLOBIN AND HEMATOCRIT, BLOOD
HEMATOCRIT: 34.1 % — AB (ref 36.0–46.0)
Hemoglobin: 11.6 g/dL — ABNORMAL LOW (ref 12.0–15.0)

## 2016-08-18 LAB — URINALYSIS, MICROSCOPIC (REFLEX): Squamous Epithelial / LPF: NONE SEEN

## 2016-08-18 LAB — T4, FREE: FREE T4: 0.65 ng/dL (ref 0.61–1.12)

## 2016-08-18 LAB — HCG, QUANTITATIVE, PREGNANCY

## 2016-08-18 LAB — TSH: TSH: 19.219 u[IU]/mL — ABNORMAL HIGH (ref 0.350–4.500)

## 2016-08-18 MED ORDER — IBUPROFEN 600 MG PO TABS: 600.0000 mg | ORAL_TABLET | Freq: Four times a day (QID) | ORAL | 1 refills | Status: AC

## 2016-08-18 MED ORDER — SODIUM CHLORIDE 0.9 % IV SOLN: INTRAVENOUS | Status: DC

## 2016-08-18 MED ORDER — ONDANSETRON HCL 4 MG/2ML IJ SOLN
4.0000 mg | Freq: Once | INTRAMUSCULAR | Status: AC
  Administered 2016-08-18: 4 mg via INTRAVENOUS
  Filled 2016-08-18: qty 2

## 2016-08-18 MED ORDER — SODIUM CHLORIDE 0.9 % IV BOLUS (SEPSIS)
1000.0000 mL | Freq: Once | INTRAVENOUS | Status: AC
  Administered 2016-08-18: 1000 mL via INTRAVENOUS

## 2016-08-18 MED ORDER — TRANEXAMIC ACID 650 MG PO TABS: 1300.0000 mg | ORAL_TABLET | Freq: Three times a day (TID) | ORAL | 0 refills | Status: AC

## 2016-08-18 MED ORDER — CIPROFLOXACIN HCL 250 MG PO TABS: 250.0000 mg | ORAL_TABLET | Freq: Two times a day (BID) | ORAL | 0 refills | Status: AC

## 2016-08-18 MED ORDER — FENTANYL CITRATE (PF) 100 MCG/2ML IJ SOLN
100.0000 ug | Freq: Once | INTRAMUSCULAR | Status: AC
Start: 2016-08-18 — End: 2016-08-18
  Administered 2016-08-18: 100 ug via INTRAVENOUS
  Filled 2016-08-18: qty 2

## 2016-08-18 MED ORDER — KETOROLAC TROMETHAMINE 30 MG/ML IJ SOLN
30.0000 mg | Freq: Once | INTRAMUSCULAR | Status: AC
  Administered 2016-08-18: 30 mg via INTRAVENOUS
  Filled 2016-08-18: qty 1

## 2016-08-18 MED ORDER — FENTANYL CITRATE (PF) 100 MCG/2ML IJ SOLN
50.0000 ug | Freq: Once | INTRAMUSCULAR | Status: AC
  Administered 2016-08-18: 50 ug via INTRAVENOUS
  Filled 2016-08-18: qty 2

## 2016-08-18 MED ORDER — FENTANYL CITRATE (PF) 100 MCG/2ML IJ SOLN
INTRAMUSCULAR | Status: AC
  Filled 2016-08-18: qty 2

## 2016-08-18 MED ORDER — TRANEXAMIC ACID 650 MG PO TABS
1300.0000 mg | ORAL_TABLET | Freq: Three times a day (TID) | ORAL | Status: DC
  Administered 2016-08-18: 1300 mg via ORAL
  Filled 2016-08-18 (×3): qty 2

## 2016-08-18 MED ORDER — MORPHINE SULFATE (PF) 4 MG/ML IV SOLN
4.0000 mg | Freq: Once | INTRAVENOUS | Status: AC
  Administered 2016-08-18: 4 mg via INTRAVENOUS
  Filled 2016-08-18: qty 1

## 2016-08-18 MED ORDER — ONDANSETRON HCL 4 MG/2ML IJ SOLN
INTRAMUSCULAR | Status: AC
  Filled 2016-08-18: qty 2

## 2016-08-18 NOTE — ED Notes (Signed)
Pt unable to void at this time. 

## 2016-08-18 NOTE — ED Notes (Signed)
ED Provider at bedside. 
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
## 2016-08-18 NOTE — Discharge Instructions (Signed)
If fully saturating one pad per hour for more than 2-3 hours

## 2016-08-18 NOTE — ED Triage Notes (Signed)
Vaginal bleeding since Thursday. Reports passing clots since yesterday, changing pads every 30 minutes. Reports it is not time for menstrual cycle. Hx of PCOS. Also reports stabbing abd pain.

## 2016-08-18 NOTE — MAU Note (Signed)
C/o heavy vaginal bleeding with severe cramping, that started yesterday afternoon;

## 2016-08-18 NOTE — ED Notes (Signed)
Ambulatory to bathroom.  Provided another pad.

## 2016-08-18 NOTE — ED Notes (Signed)
Patient stated that she has blood clots passing through her vagina.  L/R lower quadrant is tender to touch.
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
## 2016-08-21 LAB — CERVICOVAGINAL ANCILLARY ONLY
CHLAMYDIA, DNA PROBE: NEGATIVE
NEISSERIA GONORRHEA: NEGATIVE
TRICH (WINDOWPATH): NEGATIVE

## 2017-01-17 ENCOUNTER — Emergency Department (HOSPITAL_BASED_OUTPATIENT_CLINIC_OR_DEPARTMENT_OTHER): Payer: Medicaid Other

## 2017-01-17 ENCOUNTER — Emergency Department (HOSPITAL_BASED_OUTPATIENT_CLINIC_OR_DEPARTMENT_OTHER)
Admission: EM | Admit: 2017-01-17 | Discharge: 2017-01-17 | Disposition: A | Discharge status: Home / Self Care | Payer: Medicaid Other | Attending: Emergency Medicine | Admitting: Emergency Medicine

## 2017-01-17 ENCOUNTER — Encounter (HOSPITAL_BASED_OUTPATIENT_CLINIC_OR_DEPARTMENT_OTHER): Payer: Self-pay

## 2017-01-17 DIAGNOSIS — Z91040 Latex allergy status: Secondary | ICD-10-CM | POA: Insufficient documentation

## 2017-01-17 DIAGNOSIS — N200 Calculus of kidney: Secondary | ICD-10-CM | POA: Diagnosis not present

## 2017-01-17 DIAGNOSIS — Z79899 Other long term (current) drug therapy: Secondary | ICD-10-CM | POA: Diagnosis not present

## 2017-01-17 DIAGNOSIS — F17210 Nicotine dependence, cigarettes, uncomplicated: Secondary | ICD-10-CM | POA: Diagnosis not present

## 2017-01-17 DIAGNOSIS — N2889 Other specified disorders of kidney and ureter: Secondary | ICD-10-CM | POA: Diagnosis not present

## 2017-01-17 DIAGNOSIS — M545 Low back pain: Secondary | ICD-10-CM | POA: Diagnosis present

## 2017-01-17 DIAGNOSIS — E039 Hypothyroidism, unspecified: Secondary | ICD-10-CM | POA: Insufficient documentation

## 2017-01-17 LAB — URINALYSIS, MICROSCOPIC (REFLEX)
BACTERIA UA: NONE SEEN
WBC UA: NONE SEEN WBC/hpf (ref 0–5)

## 2017-01-17 LAB — CBC
HEMATOCRIT: 39.3 % (ref 36.0–46.0)
Hemoglobin: 13 g/dL (ref 12.0–15.0)
MCH: 30 pg (ref 26.0–34.0)
MCHC: 33.1 g/dL (ref 30.0–36.0)
MCV: 90.6 fL (ref 78.0–100.0)
PLATELETS: 226 10*3/uL (ref 150–400)
RBC: 4.34 MIL/uL (ref 3.87–5.11)
RDW: 14.4 % (ref 11.5–15.5)
WBC: 6.4 10*3/uL (ref 4.0–10.5)

## 2017-01-17 LAB — COMPREHENSIVE METABOLIC PANEL
ALBUMIN: 4.5 g/dL (ref 3.5–5.0)
ALT: 20 U/L (ref 14–54)
ANION GAP: 7 (ref 5–15)
AST: 24 U/L (ref 15–41)
Alkaline Phosphatase: 46 U/L (ref 38–126)
BILIRUBIN TOTAL: 0.8 mg/dL (ref 0.3–1.2)
BUN: 10 mg/dL (ref 6–20)
CHLORIDE: 104 mmol/L (ref 101–111)
CO2: 24 mmol/L (ref 22–32)
CREATININE: 0.73 mg/dL (ref 0.44–1.00)
Calcium: 9.1 mg/dL (ref 8.9–10.3)
GFR calc Af Amer: >60 mL/min (ref >60)
GFR calc non Af Amer: >60 mL/min (ref >60)
GLUCOSE: 99 mg/dL (ref 65–99)
POTASSIUM: 3.7 mmol/L (ref 3.5–5.1)
SODIUM: 135 mmol/L (ref 135–145)
TOTAL PROTEIN: 7.9 g/dL (ref 6.5–8.1)

## 2017-01-17 LAB — URINALYSIS, ROUTINE W REFLEX MICROSCOPIC
BILIRUBIN URINE: NEGATIVE
Glucose, UA: NEGATIVE mg/dL
Ketones, ur: NEGATIVE mg/dL
Leukocytes, UA: NEGATIVE
NITRITE: NEGATIVE
PH: 6.5 (ref 5.0–8.0)
Protein, ur: NEGATIVE mg/dL

## 2017-01-17 LAB — PREGNANCY, URINE: Preg Test, Ur: NEGATIVE

## 2017-01-17 MED ORDER — KETOROLAC TROMETHAMINE 10 MG PO TABS: 10.0000 mg | ORAL_TABLET | Freq: Four times a day (QID) | ORAL | 0 refills | Status: DC | PRN

## 2017-01-17 MED ORDER — TAMSULOSIN HCL 0.4 MG PO CAPS: 0.4000 mg | ORAL_CAPSULE | Freq: Every day | ORAL | 0 refills | Status: AC

## 2017-01-17 MED FILL — TAMSULOSIN HCL 0.4 MG CAP: 0.4 | 30 days supply | Qty: 30 | Fill #0

## 2017-01-17 MED FILL — KETOROLAC 10 MG TABLET: 10 | 5 days supply | Qty: 20 | Fill #0

## 2017-01-17 NOTE — ED Notes (Signed)
[facility] made appt for pt for tomorrow at [facility]0915
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
## 2017-01-17 NOTE — Discharge Instructions (Signed)
Please call and schedule a follow up appointment with Dr. Ronne BinningMcKenzie at [facility]. I would recommend calling their office before they close this afternoon.   Take one tablet of Flomax daily until you are evaluated by Urology. You may take one tablet of Toradol (ketorolac) every 6 hours as needed for pain control. You may take your next dose at 6 PM. Do not take more than four doses per day.   If you develop new or worsening symptoms, including fever, chills, vomiting, or uncontrollable pain, please return to the Emergency Department for re-evaluation.

## 2017-01-17 NOTE — ED Triage Notes (Addendum)
C/o lower back pain x today-blood in urine started yesterday-pt was seen at PCP and sent with UA results and a note that reads to r/o kidney stone

## 2017-01-18 ENCOUNTER — Encounter (HOSPITAL_BASED_OUTPATIENT_CLINIC_OR_DEPARTMENT_OTHER): Payer: Self-pay | Admitting: *Deleted

## 2017-01-18 ENCOUNTER — Other Ambulatory Visit: Payer: Self-pay | Admitting: Urology

## 2017-01-18 NOTE — Progress Notes (Signed)
NPO AFTER MN.  ARRIVE AT 0945.  CURRENT LAB RESULTS IN CHART AND EPIC.  WILL TAKE AM MEDS W/ SIPS OF WATER DOS WITH EXCEPTION NO METFORMIN.  IF NEEDED TAKE PAIN/ NAUSEA RX.

## 2017-01-19 ENCOUNTER — Encounter (HOSPITAL_COMMUNITY): Payer: Self-pay

## 2017-01-19 ENCOUNTER — Emergency Department (HOSPITAL_COMMUNITY)
Admission: EM | Admit: 2017-01-19 | Discharge: 2017-01-19 | Disposition: A | Discharge status: Home / Self Care | Payer: Medicaid Other | Attending: Emergency Medicine | Admitting: Emergency Medicine

## 2017-01-19 DIAGNOSIS — E039 Hypothyroidism, unspecified: Secondary | ICD-10-CM | POA: Insufficient documentation

## 2017-01-19 DIAGNOSIS — Z79899 Other long term (current) drug therapy: Secondary | ICD-10-CM | POA: Diagnosis not present

## 2017-01-19 DIAGNOSIS — N200 Calculus of kidney: Secondary | ICD-10-CM | POA: Diagnosis not present

## 2017-01-19 DIAGNOSIS — R109 Unspecified abdominal pain: Secondary | ICD-10-CM | POA: Diagnosis present

## 2017-01-19 DIAGNOSIS — Z91040 Latex allergy status: Secondary | ICD-10-CM | POA: Insufficient documentation

## 2017-01-19 DIAGNOSIS — F17210 Nicotine dependence, cigarettes, uncomplicated: Secondary | ICD-10-CM | POA: Insufficient documentation

## 2017-01-19 LAB — CBC WITH DIFFERENTIAL/PLATELET
BASOS PCT: 0 %
Basophils Absolute: 0 10*3/uL (ref 0.0–0.1)
EOS ABS: 0.1 10*3/uL (ref 0.0–0.7)
EOS PCT: 1 %
HCT: 36.6 % (ref 36.0–46.0)
HEMOGLOBIN: 12.3 g/dL (ref 12.0–15.0)
Lymphocytes Relative: 16 %
Lymphs Abs: 1.6 10*3/uL (ref 0.7–4.0)
MCH: 30.2 pg (ref 26.0–34.0)
MCHC: 33.6 g/dL (ref 30.0–36.0)
MCV: 89.9 fL (ref 78.0–100.0)
MONOS PCT: 8 %
Monocytes Absolute: 0.8 10*3/uL (ref 0.1–1.0)
NEUTROS PCT: 75 %
Neutro Abs: 7.3 10*3/uL (ref 1.7–7.7)
PLATELETS: 185 10*3/uL (ref 150–400)
RBC: 4.07 MIL/uL (ref 3.87–5.11)
RDW: 14.4 % (ref 11.5–15.5)
WBC: 9.7 10*3/uL (ref 4.0–10.5)

## 2017-01-19 LAB — URINALYSIS, ROUTINE W REFLEX MICROSCOPIC
Bilirubin Urine: NEGATIVE
GLUCOSE, UA: NEGATIVE mg/dL
KETONES UR: NEGATIVE mg/dL
Leukocytes, UA: NEGATIVE
NITRITE: NEGATIVE
PROTEIN: NEGATIVE mg/dL
SPECIFIC GRAVITY, URINE: 1.015 (ref 1.005–1.030)
pH: 6 (ref 5.0–8.0)

## 2017-01-19 LAB — BASIC METABOLIC PANEL
Anion gap: 12 (ref 5–15)
BUN: 13 mg/dL (ref 6–20)
CALCIUM: 9.2 mg/dL (ref 8.9–10.3)
CHLORIDE: 103 mmol/L (ref 101–111)
CO2: 24 mmol/L (ref 22–32)
CREATININE: 1.2 mg/dL — AB (ref 0.44–1.00)
Glucose, Bld: 109 mg/dL — ABNORMAL HIGH (ref 65–99)
Potassium: 3.6 mmol/L (ref 3.5–5.1)
SODIUM: 139 mmol/L (ref 135–145)

## 2017-01-19 MED ORDER — ONDANSETRON HCL 4 MG/2ML IJ SOLN
4.0000 mg | Freq: Once | INTRAMUSCULAR | Status: AC
  Administered 2017-01-19: 4 mg via INTRAVENOUS
  Filled 2017-01-19: qty 2

## 2017-01-19 MED ORDER — OXYCODONE-ACETAMINOPHEN 5-325 MG PO TABS: 1.0000 | ORAL_TABLET | ORAL | 0 refills | Status: DC | PRN

## 2017-01-19 MED ORDER — OXYCODONE-ACETAMINOPHEN 5-325 MG PO TABS
1.0000 | ORAL_TABLET | Freq: Once | ORAL | Status: AC
  Administered 2017-01-19: 1 via ORAL
  Filled 2017-01-19: qty 1

## 2017-01-19 MED ORDER — HYDROMORPHONE HCL 1 MG/ML IJ SOLN
1.0000 mg | Freq: Once | INTRAMUSCULAR | Status: AC
  Administered 2017-01-19: 1 mg via INTRAVENOUS
  Filled 2017-01-19: qty 1

## 2017-01-19 MED ORDER — SODIUM CHLORIDE 0.9 % IV BOLUS (SEPSIS)
1000.0000 mL | Freq: Once | INTRAVENOUS | Status: AC
  Administered 2017-01-19: 1000 mL via INTRAVENOUS

## 2017-01-19 MED ORDER — SODIUM CHLORIDE 0.9 % IV SOLN
INTRAVENOUS | Status: DC
  Administered 2017-01-19: 18:00:00 via INTRAVENOUS
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
## 2017-01-19 NOTE — ED Triage Notes (Signed)
She states she was dx with an 8mm left ureteral stone this week. She states she has an impending appointment for surgical intervention this week with [facility]. She rec'd. 100 mcg of Fentanyl and 4mg  Zofran IV en route to hospital. She ambulates to b.r. And back upon arrival.

## 2017-01-19 NOTE — ED Notes (Signed)
Bed: WA09 Expected date:  Expected time:  Means of arrival:  Comments: EMS- 31 yo hematuria/flank pain

## 2017-01-22 ENCOUNTER — Ambulatory Visit (HOSPITAL_BASED_OUTPATIENT_CLINIC_OR_DEPARTMENT_OTHER): Payer: Medicaid Other | Admitting: Anesthesiology

## 2017-01-22 ENCOUNTER — Ambulatory Visit (HOSPITAL_BASED_OUTPATIENT_CLINIC_OR_DEPARTMENT_OTHER)
Admission: RE | Admit: 2017-01-22 | Discharge: 2017-01-22 | Disposition: A | Discharge status: Home / Self Care | Payer: Medicaid Other | Source: Ambulatory Visit | Attending: Urology | Admitting: Urology

## 2017-01-22 ENCOUNTER — Encounter (HOSPITAL_BASED_OUTPATIENT_CLINIC_OR_DEPARTMENT_OTHER)
Admission: RE | Disposition: A | Discharge status: Home / Self Care | Payer: Self-pay | Source: Ambulatory Visit | Attending: Urology

## 2017-01-22 ENCOUNTER — Encounter (HOSPITAL_BASED_OUTPATIENT_CLINIC_OR_DEPARTMENT_OTHER): Payer: Self-pay | Admitting: Anesthesiology

## 2017-01-22 DIAGNOSIS — F17210 Nicotine dependence, cigarettes, uncomplicated: Secondary | ICD-10-CM | POA: Diagnosis not present

## 2017-01-22 DIAGNOSIS — Z79899 Other long term (current) drug therapy: Secondary | ICD-10-CM | POA: Diagnosis not present

## 2017-01-22 DIAGNOSIS — E039 Hypothyroidism, unspecified: Secondary | ICD-10-CM | POA: Diagnosis not present

## 2017-01-22 DIAGNOSIS — N132 Hydronephrosis with renal and ureteral calculous obstruction: Secondary | ICD-10-CM | POA: Diagnosis present

## 2017-01-22 DIAGNOSIS — Z79890 Hormone replacement therapy (postmenopausal): Secondary | ICD-10-CM | POA: Diagnosis not present

## 2017-01-22 DIAGNOSIS — G8929 Other chronic pain: Secondary | ICD-10-CM | POA: Insufficient documentation

## 2017-01-22 DIAGNOSIS — Z91040 Latex allergy status: Secondary | ICD-10-CM | POA: Insufficient documentation

## 2017-01-22 DIAGNOSIS — E282 Polycystic ovarian syndrome: Secondary | ICD-10-CM | POA: Diagnosis not present

## 2017-01-22 HISTORY — DX: Other seizures: G40.89

## 2017-01-22 HISTORY — DX: Anxiety disorder, unspecified: F41.9

## 2017-01-22 HISTORY — PX: CYSTOSCOPY/URETEROSCOPY/HOLMIUM LASER/STENT PLACEMENT: SHX6546

## 2017-01-22 HISTORY — PX: HOLMIUM LASER APPLICATION: SHX5852

## 2017-01-22 HISTORY — DX: Personal history of other diseases of urinary system: Z87.448

## 2017-01-22 HISTORY — DX: Urgency of urination: R39.15

## 2017-01-22 HISTORY — DX: Presence of spectacles and contact lenses: Z97.3

## 2017-01-22 HISTORY — DX: Frequency of micturition: R35.0

## 2017-01-22 HISTORY — DX: Neuralgia and neuritis, unspecified: M79.2

## 2017-01-22 HISTORY — DX: Hematuria, unspecified: R31.9

## 2017-01-22 HISTORY — DX: Personal history of (healed) traumatic fracture: Z87.81

## 2017-01-22 HISTORY — DX: Calculus of kidney: N20.0

## 2017-01-22 HISTORY — DX: Major depressive disorder, single episode, unspecified: F32.9

## 2017-01-22 SURGERY — CYSTOSCOPY/URETEROSCOPY/HOLMIUM LASER/STENT PLACEMENT
Anesthesia: General | Laterality: Left

## 2017-01-22 MED ORDER — HYDROMORPHONE HCL 1 MG/ML IJ SOLN
INTRAMUSCULAR | Status: AC
  Filled 2017-01-22: qty 1

## 2017-01-22 MED ORDER — DEXAMETHASONE SODIUM PHOSPHATE 10 MG/ML IJ SOLN
INTRAMUSCULAR | Status: AC
  Filled 2017-01-22: qty 1

## 2017-01-22 MED ORDER — IOHEXOL 300 MG/ML  SOLN
INTRAMUSCULAR | Status: DC | PRN
  Administered 2017-01-22: 10 mL via URETHRAL

## 2017-01-22 MED ORDER — CEFAZOLIN SODIUM-DEXTROSE 2-4 GM/100ML-% IV SOLN
INTRAVENOUS | Status: AC
  Filled 2017-01-22: qty 100

## 2017-01-22 MED ORDER — MEPERIDINE HCL 25 MG/ML IJ SOLN
6.2500 mg | INTRAMUSCULAR | Status: DC | PRN
  Filled 2017-01-22: qty 1

## 2017-01-22 MED ORDER — FENTANYL CITRATE (PF) 100 MCG/2ML IJ SOLN
INTRAMUSCULAR | Status: AC
  Filled 2017-01-22: qty 2

## 2017-01-22 MED ORDER — LACTATED RINGERS IV SOLN
INTRAVENOUS | Status: DC
  Administered 2017-01-22: 11:00:00 via INTRAVENOUS
  Filled 2017-01-22: qty 1000

## 2017-01-22 MED ORDER — PROMETHAZINE HCL 25 MG/ML IJ SOLN
6.2500 mg | INTRAMUSCULAR | Status: DC | PRN
  Filled 2017-01-22: qty 1

## 2017-01-22 MED ORDER — LIDOCAINE 2% (20 MG/ML) 5 ML SYRINGE
INTRAMUSCULAR | Status: DC | PRN
  Administered 2017-01-22: 30 mg via INTRAVENOUS

## 2017-01-22 MED ORDER — CEFAZOLIN SODIUM-DEXTROSE 2-4 GM/100ML-% IV SOLN
2.0000 g | Freq: Once | INTRAVENOUS | Status: AC
  Administered 2017-01-22: 2 g via INTRAVENOUS
  Filled 2017-01-22: qty 100

## 2017-01-22 MED ORDER — MIDAZOLAM HCL 2 MG/2ML IJ SOLN
INTRAMUSCULAR | Status: AC
  Filled 2017-01-22: qty 2

## 2017-01-22 MED ORDER — HYDROMORPHONE HCL 1 MG/ML IJ SOLN
0.2500 mg | INTRAMUSCULAR | Status: DC | PRN
  Administered 2017-01-22: 0.25 mg via INTRAVENOUS
  Administered 2017-01-22 (×2): 0.5 mg via INTRAVENOUS
  Filled 2017-01-22: qty 0.5

## 2017-01-22 MED ORDER — HYDROCODONE-ACETAMINOPHEN 5-325 MG PO TABS: 1.0000 | ORAL_TABLET | ORAL | 0 refills | Status: DC | PRN

## 2017-01-22 MED ORDER — SODIUM CHLORIDE 0.9 % IR SOLN
Status: DC | PRN
  Administered 2017-01-22: 4000 mL via INTRAVESICAL

## 2017-01-22 MED ORDER — DEXAMETHASONE SODIUM PHOSPHATE 10 MG/ML IJ SOLN
INTRAMUSCULAR | Status: DC | PRN
  Administered 2017-01-22: 10 mg via INTRAVENOUS

## 2017-01-22 MED ORDER — PROPOFOL 10 MG/ML IV BOLUS
INTRAVENOUS | Status: DC | PRN
  Administered 2017-01-22: 180 mg via INTRAVENOUS

## 2017-01-22 MED ORDER — PHENAZOPYRIDINE HCL 200 MG PO TABS: 200.0000 mg | ORAL_TABLET | Freq: Three times a day (TID) | ORAL | 0 refills | Status: DC | PRN

## 2017-01-22 MED ORDER — KETOROLAC TROMETHAMINE 30 MG/ML IJ SOLN
INTRAMUSCULAR | Status: AC
  Filled 2017-01-22: qty 1

## 2017-01-22 MED ORDER — HYDROCODONE-ACETAMINOPHEN 5-325 MG PO TABS
ORAL_TABLET | ORAL | Status: AC
  Filled 2017-01-22: qty 1

## 2017-01-22 MED ORDER — ONDANSETRON HCL 4 MG/2ML IJ SOLN
INTRAMUSCULAR | Status: AC
  Filled 2017-01-22: qty 2

## 2017-01-22 MED ORDER — HYDROCODONE-ACETAMINOPHEN 5-325 MG PO TABS
1.0000 | ORAL_TABLET | Freq: Once | ORAL | Status: AC
  Administered 2017-01-22: 1 via ORAL
  Filled 2017-01-22: qty 1

## 2017-01-22 MED ORDER — ONDANSETRON HCL 4 MG/2ML IJ SOLN
INTRAMUSCULAR | Status: DC | PRN
  Administered 2017-01-22: 4 mg via INTRAVENOUS

## 2017-01-22 MED ORDER — FENTANYL CITRATE (PF) 100 MCG/2ML IJ SOLN
INTRAMUSCULAR | Status: DC | PRN
  Administered 2017-01-22 (×2): 50 ug via INTRAVENOUS

## 2017-01-22 MED ORDER — EPHEDRINE 5 MG/ML INJ
INTRAVENOUS | Status: AC
  Filled 2017-01-22: qty 10

## 2017-01-22 MED ORDER — MIDAZOLAM HCL 2 MG/2ML IJ SOLN
0.5000 mg | Freq: Once | INTRAMUSCULAR | Status: DC | PRN
  Filled 2017-01-22: qty 2

## 2017-01-22 MED ORDER — KETOROLAC TROMETHAMINE 30 MG/ML IJ SOLN
INTRAMUSCULAR | Status: DC | PRN
  Administered 2017-01-22: 30 mg via INTRAVENOUS

## 2017-01-22 MED ORDER — MIDAZOLAM HCL 2 MG/2ML IJ SOLN
INTRAMUSCULAR | Status: DC | PRN
  Administered 2017-01-22: 2 mg via INTRAVENOUS

## 2017-01-22 SURGICAL SUPPLY — 31 items
BAG DRAIN URO-CYSTO SKYTR STRL (DRAIN) ×4 IMPLANT
BASKET STONE 1.7 NGAGE (UROLOGICAL SUPPLIES) IMPLANT
BASKET ZERO TIP NITINOL 2.4FR (BASKET) ×4 IMPLANT
BENZOIN TINCTURE PRP APPL 2/3 (GAUZE/BANDAGES/DRESSINGS) IMPLANT
CATH INTERMIT  6FR 70CM (CATHETERS) ×4 IMPLANT
CLOSURE WOUND 1/2 X4 (GAUZE/BANDAGES/DRESSINGS)
CLOTH BEACON ORANGE TIMEOUT ST (SAFETY) ×4 IMPLANT
FIBER LASER FLEXIVA 365 (UROLOGICAL SUPPLIES) ×4 IMPLANT
FIBER LASER TRAC TIP (UROLOGICAL SUPPLIES) IMPLANT
GLOVE BIO SURGEON STRL SZ 6.5 (GLOVE) ×3 IMPLANT
GLOVE BIO SURGEON STRL SZ7.5 (GLOVE) ×4 IMPLANT
GLOVE BIO SURGEONS STRL SZ 6.5 (GLOVE) ×1
GLOVE BIOGEL PI IND STRL 6.5 (GLOVE) ×4 IMPLANT
GLOVE BIOGEL PI INDICATOR 6.5 (GLOVE) ×4
GOWN STRL REUS W/TWL LRG LVL3 (GOWN DISPOSABLE) ×4 IMPLANT
GOWN STRL REUS W/TWL XL LVL3 (GOWN DISPOSABLE) ×4 IMPLANT
GUIDEWIRE ANG ZIPWIRE 038X150 (WIRE) ×4 IMPLANT
GUIDEWIRE STR DUAL SENSOR (WIRE) IMPLANT
INFUSOR MANOMETER BAG 3000ML (MISCELLANEOUS) ×4 IMPLANT
IV NS 1000ML (IV SOLUTION) ×2
IV NS 1000ML BAXH (IV SOLUTION) ×2 IMPLANT
IV NS IRRIG 3000ML ARTHROMATIC (IV SOLUTION) ×4 IMPLANT
KIT RM TURNOVER CYSTO AR (KITS) ×4 IMPLANT
MANIFOLD NEPTUNE II (INSTRUMENTS) ×4 IMPLANT
NS IRRIG 500ML POUR BTL (IV SOLUTION) ×4 IMPLANT
PACK CYSTO (CUSTOM PROCEDURE TRAY) ×4 IMPLANT
STENT URET 6FRX26 CONTOUR (STENTS) ×4 IMPLANT
STRIP CLOSURE SKIN 1/2X4 (GAUZE/BANDAGES/DRESSINGS) IMPLANT
SYRINGE 10CC LL (SYRINGE) ×4 IMPLANT
TUBE CONNECTING 12'X1/4 (SUCTIONS) ×1
TUBE CONNECTING 12X1/4 (SUCTIONS) ×3 IMPLANT

## 2017-01-22 NOTE — Anesthesia Postprocedure Evaluation (Signed)
Anesthesia Post Note  Patient: Denise Griffin  Procedure(s) Performed: CYSTOSCOPY/RETROGRADE/URETEROSCOPY/HOLMIUM LASER/STENT PLACEMENT LEFT URETER (Left ) HOLMIUM LASER APPLICATION     Patient location during evaluation: PACU Anesthesia Type: General Level of consciousness: awake and alert, patient cooperative and oriented Pain management: pain level controlled (pain improving) Vital Signs Assessment: post-procedure vital signs reviewed and stable Respiratory status: spontaneous breathing, nonlabored ventilation and respiratory function stable Cardiovascular status: blood pressure returned to baseline and stable Postop Assessment: no apparent nausea or vomiting Anesthetic complications: no    Last Vitals:  Vitals:   01/22/17 1300 01/22/17 1310  BP: 98/74   Pulse: 62 61  Resp: 16 16  Temp:    SpO2: 96% 99%    Last Pain:  Vitals:   01/22/17 1254  TempSrc:   PainSc: 4                  Labrenda Lasky,E. Aleksis Jiggetts

## 2017-01-22 NOTE — Interval H&P Note (Signed)
History and Physical Interval Note:  01/22/2017 10:40 AM  Denise Griffin  has presented today for surgery, with the diagnosis of LEFT URETEROPELVIC JUNCTION STONE  The various methods of treatment have been discussed with the patient and family. After consideration of risks, benefits and other options for treatment, the patient has consented to  Procedure(s) with comments: CYSTOSCOPY/RETROGRADE/URETEROSCOPY/HOLMIUM LASER/STENT PLACEMENT (Left) - ONLY NEEDS 30 MIN FOR PROCEDURE as a surgical intervention .  The patient's history has been reviewed, patient examined, no change in status, stable for surgery.  I have reviewed the patient's chart and labs.  Questions were answered to the patient's satisfaction.     Dorian Furnacehristopher Aaron Rasean Joos

## 2017-01-22 NOTE — Progress Notes (Signed)
Do not take any nonsteroidal anti inflammatories until after 6:15 pm today written on discharge instructions for patient.

## 2017-01-22 NOTE — Transfer of Care (Signed)
Immediate Anesthesia Transfer of Care Note  Patient: Denise Griffin  Procedure(s) Performed: CYSTOSCOPY/RETROGRADE/URETEROSCOPY/HOLMIUM LASER/STENT PLACEMENT LEFT URETER (Left ) HOLMIUM LASER APPLICATION  Patient Location: PACU  Anesthesia Type:General  Level of Consciousness: awake, alert , oriented and patient cooperative  Airway & Oxygen Therapy: Patient Spontanous Breathing and Patient connected to nasal cannula oxygen  Post-op Assessment: Report given to RN and Post -op Vital signs reviewed and stable  Post vital signs: Reviewed and stable  Last Vitals:  Vitals:   01/22/17 0957  BP: (!) 109/59  Pulse: 74  Resp: 16  Temp: 37.4 C  SpO2: 99%    Last Pain:  Vitals:   01/22/17 1023  TempSrc:   PainSc: 6       Patients Stated Pain Goal: 9 (01/22/17 1023)  Complications: No apparent anesthesia complications

## 2017-01-22 NOTE — Anesthesia Preprocedure Evaluation (Addendum)
Anesthesia Evaluation  Patient identified by MRN, date of birth, ID band Patient awake    Reviewed: Allergy & Precautions, NPO status , Patient's Chart, lab work & pertinent test results  History of Anesthesia Complications Negative for: history of anesthetic complications  Airway Mallampati: II  TM Distance: >3 FB Neck ROM: Full    Dental  (+) Dental Advisory Given   Pulmonary Current Smoker,    breath sounds clear to auscultation       Cardiovascular negative cardio ROS   Rhythm:Regular Rate:Normal     Neuro/Psych  Headaches, Anxiety Depression Chronic pain    GI/Hepatic negative GI ROS, Neg liver ROS,   Endo/Other  Hypothyroidism Morbid obesityPolycystic ovarian: metformin  Renal/GU stones     Musculoskeletal   Abdominal (+) + obese,   Peds  Hematology negative hematology ROS (+)   Anesthesia Other Findings   Reproductive/Obstetrics                            Anesthesia Physical Anesthesia Plan  ASA: II  Anesthesia Plan: General   Post-op Pain Management:    Induction: Intravenous  PONV Risk Score and Plan: 3 and Ondansetron, Dexamethasone and Midazolam  Airway Management Planned: LMA  Additional Equipment:   Intra-op Plan:   Post-operative Plan:   Informed Consent: I have reviewed the patients History and Physical, chart, labs and discussed the procedure including the risks, benefits and alternatives for the proposed anesthesia with the patient or authorized representative who has indicated his/her understanding and acceptance.   Dental advisory given  Plan Discussed with: CRNA and Surgeon  Anesthesia Plan Comments: (Plan routine monitors, GA- LMA OK)        Anesthesia Quick Evaluation
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
## 2017-01-22 NOTE — Anesthesia Procedure Notes (Signed)
Procedure Name: LMA Insertion Date/Time: 01/22/2017 11:48 AM Performed by: Tyrone NineSAUVE, Lilac Hoff F Pre-anesthesia Checklist: Patient identified, Timeout performed, Emergency Drugs available, Suction available and Patient being monitored Patient Re-evaluated:Patient Re-evaluated prior to induction Oxygen Delivery Method: Circle system utilized Preoxygenation: Pre-oxygenation with 100% oxygen Induction Type: IV induction Ventilation: Mask ventilation without difficulty LMA: LMA inserted LMA Size: 4.0 Number of attempts: 1 Placement Confirmation: breath sounds checked- equal and bilateral and positive ETCO2 Tube secured with: Tape Dental Injury: Teeth and Oropharynx as per pre-operative assessment

## 2017-01-22 NOTE — Discharge Instructions (Signed)
°  Post Anesthesia Home Care Instructions  Activity: Get plenty of rest for the remainder of the day. A responsible individual must stay with you for 24 hours following the procedure.  For the next 24 hours, DO NOT: -Drive a car -Advertising copywriterperate machinery -Drink alcoholic beverages -Take any medication unless instructed by your physician -Make any legal decisions or sign important papers.  Meals: Start with liquid foods such as gelatin or soup. Progress to regular foods as tolerated. Avoid greasy, spicy, heavy foods. If nausea and/or vomiting occur, drink only clear liquids until the nausea and/or vomiting subsides. Call your physician if vomiting continues.  Special Instructions/Symptoms: Your throat may feel dry or sore from the anesthesia or the breathing tube placed in your throat during surgery. If this causes discomfort, gargle with warm salt water. The discomfort should disappear within 24 hours.  If you had a scopolamine patch placed behind your ear for the management of post- operative nausea and/or vomiting:  1. The medication in the patch is effective for 72 hours, after which it should be removed.  Wrap patch in a tissue and discard in the trash. Wash hands thoroughly with soap and water. 2. You may remove the patch earlier than 72 hours if you experience unpleasant side effects which may include dry mouth, dizziness or visual disturbances. 3. Avoid touching the patch. Wash your hands with soap and water after contact with the patch.   Remove you stent, by pulling the string exiting your urethra, on 01/25/17 at 0600.

## 2017-01-22 NOTE — Op Note (Signed)
Operative Note  Preoperative diagnosis:  1.  7 mm left UPJ stone  Postoperative diagnosis: 1.  7 mm left midureteral stone  Procedure(s): 1.  Cystoscopy 2.  Left retrograde pyelogram with intra-op interpretation 3.  Left semi-rigid ureteroscopy 4.  Laser lithotripsy 5.  Left JJ stent placement with tether  Surgeon: Rhoderick Moodyhristopher Thersea Manfredonia, MD  Assistants:  None  Anesthesia:  Gen LMA  Complications:  None  EBL:  <5 mL  Specimens: 1. Left ureteral stone  Drains/Catheters: 1.  Left 85F JJ stent with tether  Intraoperative findings:  Obstructing left mid-ureteral stone  Indication:  Denise Griffin is a 31 y.o. female with left flank pain. CT stone study (08/17/2016)-7 mm left UPJ stone with mild hydronephrosis. Durasphere injections noted within each UVJ. Currently, the patient is having intermittent left flank pain associated with nausea, but states that her pain is controlled with toradol. She denies fever/chills or dysuria. She did have a couple episodes of gross hematuria while being evaluated in the emergency department, but denies any blood in her urine today. Unable to provide a urine specimen today. She has a history of vesicoureteral reflux and had deflux injections at age 31.  Description of procedure:  After informed consent was obtained, the patient was brought to the operating room and general LMA anesthesia was administered. The patient was then placed in the dorsolithotomy position and prepped and draped in usual sterile fashion. A timeout was performed. A 21 French rigid cystoscope was then inserted into the urethral meatus and advanced into the bladder under direct vision. A complete bladder survey revealed no intravesical pathology.  A 6 [place] open-ended catheter was then inserted into the left ureteral orifice and a retrograde pyelogram was obtained, which demonstrated a filling defect within the distal to medial aspects of the left ureter and minimal dilation of  the left renal pelvis. There was crisp outlining of all renal calyces with no other filling defects. There were no distal filling defects within the left ureter. A Glidewire was then advanced through the lumen of the ureteral catheter and up to the left renal pelvis, under fluoroscopic guidance.  The cystoscope was then removed and exchanged for the semi-rigid ureteroscope, which was navigated up the left ureter until the obstructing stone was identified. A 365 holmium laser was then used to fracture the stone into numerous smaller fragments.  A tipless basket was then used to remove all stone fragments from the lumen of the left ureter.  The ureteroscope was then removed.  A 6 [place] JJ stent was then placed over the wire and into good position within the left collecting system, confirming placement via fluoroscopy. The tether the stent was left in place. The patient's bladder was then completely drained and all stone fragments were removed1.  The patient tolerated the procedure well and was transferred to the postanesthesia unit in stable condition.  Plan: The patient has been instructed to remove her left JJ stent on 01/25/17 at 0600 and follow up for a RUS in 6 weeks.

## 2017-01-23 ENCOUNTER — Encounter (HOSPITAL_BASED_OUTPATIENT_CLINIC_OR_DEPARTMENT_OTHER): Payer: Self-pay | Admitting: Urology

## 2017-05-12 ENCOUNTER — Other Ambulatory Visit: Payer: Self-pay

## 2017-05-12 ENCOUNTER — Encounter (HOSPITAL_BASED_OUTPATIENT_CLINIC_OR_DEPARTMENT_OTHER): Payer: Self-pay | Admitting: Emergency Medicine

## 2017-05-12 ENCOUNTER — Emergency Department (HOSPITAL_BASED_OUTPATIENT_CLINIC_OR_DEPARTMENT_OTHER): Payer: Medicaid Other

## 2017-05-12 ENCOUNTER — Emergency Department (HOSPITAL_BASED_OUTPATIENT_CLINIC_OR_DEPARTMENT_OTHER)
Admission: EM | Admit: 2017-05-12 | Discharge: 2017-05-13 | Disposition: A | Discharge status: Home / Self Care | Payer: Medicaid Other | Attending: Emergency Medicine | Admitting: Emergency Medicine

## 2017-05-12 DIAGNOSIS — Z91040 Latex allergy status: Secondary | ICD-10-CM | POA: Insufficient documentation

## 2017-05-12 DIAGNOSIS — E039 Hypothyroidism, unspecified: Secondary | ICD-10-CM | POA: Diagnosis not present

## 2017-05-12 DIAGNOSIS — F17210 Nicotine dependence, cigarettes, uncomplicated: Secondary | ICD-10-CM | POA: Insufficient documentation

## 2017-05-12 DIAGNOSIS — Z79899 Other long term (current) drug therapy: Secondary | ICD-10-CM | POA: Diagnosis not present

## 2017-05-12 DIAGNOSIS — N938 Other specified abnormal uterine and vaginal bleeding: Secondary | ICD-10-CM | POA: Diagnosis present

## 2017-05-12 DIAGNOSIS — N939 Abnormal uterine and vaginal bleeding, unspecified: Secondary | ICD-10-CM

## 2017-05-12 DIAGNOSIS — E282 Polycystic ovarian syndrome: Secondary | ICD-10-CM | POA: Diagnosis not present

## 2017-05-12 LAB — URINALYSIS, ROUTINE W REFLEX MICROSCOPIC

## 2017-05-12 LAB — CBC WITH DIFFERENTIAL/PLATELET
BASOS PCT: 0 %
Basophils Absolute: 0 10*3/uL (ref 0.0–0.1)
EOS ABS: 0.2 10*3/uL (ref 0.0–0.7)
Eosinophils Relative: 3 %
HEMATOCRIT: 38.9 % (ref 36.0–46.0)
Hemoglobin: 13.1 g/dL (ref 12.0–15.0)
LYMPHS ABS: 2.3 10*3/uL (ref 0.7–4.0)
Lymphocytes Relative: 41 %
MCH: 31.2 pg (ref 26.0–34.0)
MCHC: 33.7 g/dL (ref 30.0–36.0)
MCV: 92.6 fL (ref 78.0–100.0)
MONOS PCT: 8 %
Monocytes Absolute: 0.5 10*3/uL (ref 0.1–1.0)
NEUTROS ABS: 2.7 10*3/uL (ref 1.7–7.7)
NEUTROS PCT: 48 %
Platelets: 233 10*3/uL (ref 150–400)
RBC: 4.2 MIL/uL (ref 3.87–5.11)
RDW: 12.5 % (ref 11.5–15.5)
WBC: 5.7 10*3/uL (ref 4.0–10.5)

## 2017-05-12 LAB — PREGNANCY, URINE: Preg Test, Ur: NEGATIVE

## 2017-05-12 LAB — URINALYSIS, MICROSCOPIC (REFLEX)

## 2017-05-12 LAB — HEMOGLOBIN AND HEMATOCRIT, BLOOD
HEMATOCRIT: 35.4 % — AB (ref 36.0–46.0)
HEMOGLOBIN: 12 g/dL (ref 12.0–15.0)

## 2017-05-12 MED ORDER — MEGESTROL ACETATE 40 MG PO TABS
40.0000 mg | ORAL_TABLET | Freq: Every day | ORAL | Status: DC
  Filled 2017-05-12: qty 1

## 2017-05-12 MED ORDER — MEGESTROL ACETATE 40 MG PO TABS: 40.0000 mg | ORAL_TABLET | Freq: Two times a day (BID) | ORAL | 0 refills | Status: DC

## 2017-05-12 MED ORDER — SODIUM CHLORIDE 0.9 % IV BOLUS (SEPSIS)
1000.0000 mL | Freq: Once | INTRAVENOUS | Status: AC
  Administered 2017-05-12: 1000 mL via INTRAVENOUS

## 2017-05-12 MED ORDER — FENTANYL CITRATE (PF) 100 MCG/2ML IJ SOLN
50.0000 ug | Freq: Once | INTRAMUSCULAR | Status: AC
  Administered 2017-05-12: 50 ug via INTRAVENOUS
  Filled 2017-05-12: qty 2

## 2017-05-12 MED ORDER — KETOROLAC TROMETHAMINE 30 MG/ML IJ SOLN
15.0000 mg | Freq: Once | INTRAMUSCULAR | Status: AC
  Administered 2017-05-12: 15 mg via INTRAVENOUS
  Filled 2017-05-12: qty 1

## 2017-05-12 MED ORDER — IBUPROFEN 800 MG PO TABS: 800.0000 mg | ORAL_TABLET | Freq: Three times a day (TID) | ORAL | 0 refills | Status: AC

## 2017-05-12 MED ORDER — OXYCODONE-ACETAMINOPHEN 5-325 MG PO TABS
1.0000 | ORAL_TABLET | Freq: Once | ORAL | Status: AC
  Administered 2017-05-12: 1 via ORAL
  Filled 2017-05-12: qty 1

## 2017-05-12 NOTE — Discharge Instructions (Signed)
As discussed, take Megace twice daily until the bleeding stops and once daily thereafter. Follow up with the women's outpatient clinic or your GYN.  Return if symptoms worsen, weakness, lightheadedness, chest pain, shortness of breath, or other new concerning symptoms in the meantime.

## 2017-05-12 NOTE — ED Notes (Signed)
Patient transported to Ultrasound 

## 2017-05-12 NOTE — ED Triage Notes (Signed)
Patient states that she has had increased Vaginal bleeding over the last 2 -3 days. She reports that she is saturating an overnight pad every hour to 1 - 1.5 hours. The patient reports that she is dizzy and lightheaded as well

## 2017-05-12 NOTE — ED Notes (Signed)
ED Provider at bedside. Pelvic exam being done.
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
## 2017-05-13 MED ORDER — SODIUM CHLORIDE 0.9 % IV BOLUS (SEPSIS)
1000.0000 mL | Freq: Once | INTRAVENOUS | Status: AC
  Administered 2017-05-13: 1000 mL via INTRAVENOUS

## 2017-05-13 NOTE — ED Notes (Signed)
Pt reporting that she feels lightheaded even while lying in the bed. PA-C made aware and is consulting with MD at this time.

## 2017-05-13 NOTE — ED Notes (Signed)
Pt instructed to sit on side of stretcher, get dressed and move around the room to see how she feels. Will notify us if she feels dizzy. Husband at bedside to assist.

## 2017-05-13 NOTE — ED Notes (Signed)
Pt given d/c instructions as per chart. Rx x 2. Verbalizes understanding. No questions. 

## 2017-05-22 ENCOUNTER — Ambulatory Visit (INDEPENDENT_AMBULATORY_CARE_PROVIDER_SITE_OTHER): Payer: Medicaid Other | Admitting: Obstetrics and Gynecology

## 2017-05-22 ENCOUNTER — Encounter: Payer: Self-pay | Admitting: Obstetrics and Gynecology

## 2017-05-22 VITALS — BP 108/74 | HR 68 | Wt 216.0 lb

## 2017-05-22 DIAGNOSIS — N938 Other specified abnormal uterine and vaginal bleeding: Secondary | ICD-10-CM

## 2017-05-22 MED ORDER — MEGESTROL ACETATE 40 MG PO TABS: 40.0000 mg | ORAL_TABLET | Freq: Two times a day (BID) | ORAL | 5 refills | Status: DC
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
## 2017-05-22 NOTE — Patient Instructions (Addendum)
Polycystic Ovarian Syndrome Polycystic ovarian syndrome (PCOS) is a common hormonal disorder among women of reproductive age. In most women with PCOS, many small fluid-filled sacs (cysts) grow on the ovaries, and the cysts are not part of a normal menstrual cycle. PCOS can cause problems with your menstrual periods and make it difficult to get pregnant. It can also cause an increased risk of miscarriage with pregnancy. If it is not treated, PCOS can lead to serious health problems, such as diabetes and heart disease. What are the causes? The cause of PCOS is not known, but it may be the result of a combination of certain factors, such as:  Irregular menstrual cycle.  High levels of certain hormones (androgens).  Problems with the hormone that helps to control blood sugar (insulin resistance).  Certain genes.  What increases the risk? This condition is more likely to develop in women who have a family history of PCOS. What are the signs or symptoms? Symptoms of PCOS may include:  Multiple ovarian cysts.  Infrequent periods or no periods.  Periods that are too frequent or too heavy.  Unpredictable periods.  Inability to get pregnant (infertility) because of not ovulating.  Increased growth of hair on the face, chest, stomach, back, thumbs, thighs, or toes.  Acne or oily skin. Acne may develop during adulthood, and it may not respond to treatment.  Pelvic pain.  Weight gain or obesity.  Patches of thickened and dark brown or black skin on the neck, arms, breasts, or thighs (acanthosis nigricans).  Excess hair growth on the face, chest, abdomen, or upper thighs (hirsutism).  How is this diagnosed? This condition is diagnosed based on:  Your medical history.  A physical exam, including a pelvic exam. Your health care provider may look for areas of increased hair growth on your skin.  Tests, such as: ? Ultrasound. This may be used to examine the ovaries and the lining of the  uterus (endometrium) for cysts. ? Blood tests. These may be used to check levels of sugar (glucose), female hormone (testosterone), and female hormones (estrogen and progesterone) in your blood.  How is this treated? There is no cure for PCOS, but treatment can help to manage symptoms and prevent more health problems from developing. Treatment varies depending on:  Your symptoms.  Whether you want to have a baby or whether you need birth control (contraception).  Treatment may include nutrition and lifestyle changes along with:  Progesterone hormone to start a menstrual period.  Birth control pills to help you have regular menstrual periods.  Medicines to make you ovulate, if you want to get pregnant.  Medicine to reduce excessive hair growth.  Surgery, in severe cases. This may involve making small holes in one or both of your ovaries. This decreases the amount of testosterone that your body produces.  Follow these instructions at home:  Take over-the-counter and prescription medicines only as told by your health care provider.  Follow a healthy meal plan. This can help you reduce the effects of PCOS. ? Eat a healthy diet that includes lean proteins, complex carbohydrates, fresh fruits and vegetables, low-fat dairy products, and healthy fats. Make sure to eat enough fiber.  If you are overweight, lose weight as told by your health care provider. ? Losing 10% of your body weight may improve symptoms. ? Your health care provider can determine how much weight loss is best for you and can help you lose weight safely.  Keep all follow-up visits as told by   your health care provider. This is important. Contact a health care provider if:  Your symptoms do not get better with medicine.  You develop new symptoms. This information is not intended to replace advice given to you by your health care provider. Make sure you discuss any questions you have with your health care  provider. Document Released: 07/06/2004 Document Revised: 11/08/2015 Document Reviewed: 08/28/2015 Elsevier Interactive Patient Education  2018 [employer]   Diet for Polycystic Ovarian Syndrome Polycystic ovary syndrome (PCOS) is a disorder of the chemical messengers (hormones) that regulate menstruation. The condition causes important hormones to be out of balance. PCOS can:  Make your periods irregular or stop.  Cause cysts to develop on the ovaries.  Make it difficult to get pregnant.  Stop your body from responding to the effects of insulin (insulin resistance), which can lead to obesity and diabetes.  Changing what you eat can help manage PCOS and improve your health. It can help you lose weight and improve the way your body uses insulin. What is my plan?  Eat breakfast, lunch, and dinner plus two snacks every day.  Include protein in each meal and snack.  Choose whole grains instead of products made with refined flour.  Eat a variety of foods.  Exercise regularly as told by your health care provider. What do I need to know about this eating plan? If you are overweight or obese, pay attention to how many calories you eat. Cutting down on calories can help you lose weight. Work with your health care provider or dietitian to figure out how many calories you need each day. What foods can I eat? Grains Whole grains, such as whole wheat. Whole-grain breads, crackers, cereals, and pasta. Unsweetened oatmeal, bulgur, barley, quinoa, or brown rice. Corn or whole-wheat flour tortillas. Vegetables  Lettuce. Spinach. Peas. Beets. Cauliflower. Cabbage. Broccoli. Carrots. Tomatoes. Squash. Eggplant. Herbs. Peppers. Onions. Cucumbers. Brussels sprouts. Fruits Berries. Bananas. Apples. Oranges. Grapes. Papaya. Mango. Pomegranate. Kiwi. Grapefruit. Cherries. Meats and Other Protein Sources Lean proteins, such as fish, chicken, beans, eggs, and tofu. Dairy Low-fat dairy products,  such as skim milk, cheese sticks, and yogurt. Beverages Low-fat or fat-free drinks, such as water, low-fat milk, sugar-free drinks, and 100% fruit juice. Condiments Ketchup. Mustard. Barbecue sauce. Relish. Low-fat or fat-free mayonnaise. Fats and Oils Olive oil or canola oil. Walnuts and almonds. The items listed above may not be a complete list of recommended foods or beverages. Contact your dietitian for more options. What foods are not recommended? Foods high in calories or fat. Fried foods. Sweets. Products made from refined white flour, including white bread, pastries, white rice, and pasta. The items listed above may not be a complete list of foods and beverages to avoid. Contact your dietitian for more information. This information is not intended to replace advice given to you by your health care provider. Make sure you discuss any questions you have with your health care provider. Document Released: 07/04/2015 Document Revised: 08/18/2015 Document Reviewed: 03/24/2014 Elsevier Interactive Patient Education  2018 [employer]   Contraception Choices Contraception, also called birth control, refers to methods or devices that prevent pregnancy. Hormonal methods Contraceptive implant A contraceptive implant is a thin, plastic tube that contains a hormone. It is inserted into the upper part of the arm. It can remain in place for up to 3 years. Progestin-only injections Progestin-only injections are injections of progestin, a synthetic form of the hormone progesterone. They are given every 3 months by a health care  provider. Birth control pills Birth control pills are pills that contain hormones that prevent pregnancy. They must be taken once a day, preferably at the same time each day. Birth control patch The birth control patch contains hormones that prevent pregnancy. It is placed on the skin and must be changed once a week for three weeks and removed on the fourth week. A  prescription is needed to use this method of contraception. Vaginal ring A vaginal ring contains hormones that prevent pregnancy. It is placed in the vagina for three weeks and removed on the fourth week. After that, the process is repeated with a new ring. A prescription is needed to use this method of contraception. Emergency contraceptive Emergency contraceptives prevent pregnancy after unprotected sex. They come in pill form and can be taken up to 5 days after sex. They work best the sooner they are taken after having sex. Most emergency contraceptives are available without a prescription. This method should not be used as your only form of birth control. Barrier methods Female condom A female condom is a thin sheath that is worn over the penis during sex. Condoms keep sperm from going inside a woman's body. They can be used with a spermicide to increase their effectiveness. They should be disposed after a single use. Female condom A female condom is a soft, loose-fitting sheath that is put into the vagina before sex. The condom keeps sperm from going inside a woman's body. They should be disposed after a single use. Diaphragm A diaphragm is a soft, dome-shaped barrier. It is inserted into the vagina before sex, along with a spermicide. The diaphragm blocks sperm from entering the uterus, and the spermicide kills sperm. A diaphragm should be left in the vagina for 6-8 hours after sex and removed within 24 hours. A diaphragm is prescribed and fitted by a health care provider. A diaphragm should be replaced every 1-2 years, after giving birth, after gaining more than 15 lb (6.8 kg), and after pelvic surgery. Cervical cap A cervical cap is a round, soft latex or plastic cup that fits over the cervix. It is inserted into the vagina before sex, along with spermicide. It blocks sperm from entering the uterus. The cap should be left in place for 6-8 hours after sex and removed within 48 hours. A cervical cap  must be prescribed and fitted by a health care provider. It should be replaced every 2 years. Sponge A sponge is a soft, circular piece of polyurethane foam with spermicide on it. The sponge helps block sperm from entering the uterus, and the spermicide kills sperm. To use it, you make it wet and then insert it into the vagina. It should be inserted before sex, left in for at least 6 hours after sex, and removed and thrown away within 30 hours. Spermicides Spermicides are chemicals that kill or block sperm from entering the cervix and uterus. They can come as a cream, jelly, suppository, foam, or tablet. A spermicide should be inserted into the vagina with an applicator at least 10-15 minutes before sex to allow time for it to work. The process must be repeated every time you have sex. Spermicides do not require a prescription. Intrauterine contraception Intrauterine device (IUD) An IUD is a T-shaped device that is put in a woman's uterus. There are two types:  Hormone IUD.This type contains progestin, a synthetic form of the hormone progesterone. This type can stay in place for 3-5 years.  Copper IUD.This type is wrapped in  copper wire. It can stay in place for 10 years.  Permanent methods of contraception Female tubal ligation In this method, a woman's fallopian tubes are sealed, tied, or blocked during surgery to prevent eggs from traveling to the uterus. Hysteroscopic sterilization In this method, a small, flexible insert is placed into each fallopian tube. The inserts cause scar tissue to form in the fallopian tubes and block them, so sperm cannot reach an egg. The procedure takes about 3 months to be effective. Another form of birth control must be used during those 3 months. Female sterilization This is a procedure to tie off the tubes that carry sperm (vasectomy). After the procedure, the man can still ejaculate fluid (semen). Natural planning methods Natural family planning In this  method, a couple does not have sex on days when the woman could become pregnant. Calendar method This means keeping track of the length of each menstrual cycle, identifying the days when pregnancy can happen, and not having sex on those days. Ovulation method In this method, a couple avoids sex during ovulation. Symptothermal method This method involves not having sex during ovulation. The woman typically checks for ovulation by watching changes in her temperature and in the consistency of cervical mucus. Post-ovulation method In this method, a couple waits to have sex until after ovulation. Summary  Contraception, also called birth control, means methods or devices that prevent pregnancy.  Hormonal methods of contraception include implants, injections, pills, patches, vaginal rings, and emergency contraceptives.  Barrier methods of contraception can include female condoms, female condoms, diaphragms, cervical caps, sponges, and spermicides.  There are two types of IUDs (intrauterine devices). An IUD can be put in a woman's uterus to prevent pregnancy for 3-5 years.  Permanent sterilization can be done through a procedure for males, females, or both.  Natural family planning methods involve not having sex on days when the woman could become pregnant. This information is not intended to replace advice given to you by your health care provider. Make sure you discuss any questions you have with your health care provider. Document Released: 03/12/2005 Document Revised: 04/14/2016 Document Reviewed: 04/14/2016 Elsevier Interactive Patient Education  2018 Elsevier Inc.   Endometrial Ablation Endometrial ablation is a procedure that destroys the thin inner layer of the lining of the uterus (endometrium). This procedure may be done:  To stop heavy periods.  To stop bleeding that is causing anemia.  To control irregular bleeding.  To treat bleeding caused by small tumors (fibroids) in the  endometrium.  This procedure is often an alternative to major surgery, such as removal of the uterus and cervix (hysterectomy). As a result of this procedure:  You may not be able to have children. However, if you are premenopausal (you have not gone through menopause): ? You may still have a small chance of getting pregnant. ? You will need to use a reliable method of birth control after the procedure to prevent pregnancy.  You may stop having a menstrual period, or you may have only a small amount of bleeding during your period. Menstruation may return several years after the procedure.  Tell a health care provider about:  Any allergies you have.  All medicines you are taking, including vitamins, herbs, eye drops, creams, and over-the-counter medicines.  Any problems you or family members have had with the use of anesthetic medicines.  Any blood disorders you have.  Any surgeries you have had.  Any medical conditions you have. What are the risks? Generally, this  is a safe procedure. However, problems may occur, including:  A hole (perforation) in the uterus or bowel.  Infection of the uterus, bladder, or vagina.  Bleeding.  Damage to other structures or organs.  An air bubble in the lung (air embolus).  Problems with pregnancy after the procedure.  Failure of the procedure.  Decreased ability to diagnose cancer in the endometrium.  What happens before the procedure?  You will have tests of your endometrium to make sure there are no pre-cancerous cells or cancer cells present.  You may have an ultrasound of the uterus.  You may be given medicines to thin the endometrium.  Ask your health care provider about: ? Changing or stopping your regular medicines. This is especially important if you take diabetes medicines or blood thinners. ? Taking medicines such as aspirin and ibuprofen. These medicines can thin your blood. Do not take these medicines before your  procedure if your doctor tells you not to.  Plan to have someone take you home from the hospital or clinic. What happens during the procedure?  You will lie on an exam table with your feet and legs supported as in a pelvic exam.  To lower your risk of infection: ? Your health care team will wash or sanitize their hands and put on germ-free (sterile) gloves. ? Your genital area will be washed with soap.  An IV tube will be inserted into one of your veins.  You will be given a medicine to help you relax (sedative).  A surgical instrument with a light and camera (resectoscope) will be inserted into your vagina and moved into your uterus. This allows your surgeon to see inside your uterus.  Endometrial tissue will be removed using one of the following methods: ? Radiofrequency. This method uses a radiofrequency-alternating electric current to remove the endometrium. ? Cryotherapy. This method uses extreme cold to freeze the endometrium. ? Heated-free liquid. This method uses a heated saltwater (saline) solution to remove the endometrium. ? Microwave. This method uses high-energy microwaves to heat up the endometrium and remove it. ? Thermal balloon. This method involves inserting a catheter with a balloon tip into the uterus. The balloon tip is filled with heated fluid to remove the endometrium. The procedure may vary among health care providers and hospitals. What happens after the procedure?  Your blood pressure, heart rate, breathing rate, and blood oxygen level will be monitored until the medicines you were given have worn off.  As tissue healing occurs, you may notice vaginal bleeding for 4-6 weeks after the procedure. You may also experience: ? Cramps. ? Thin, watery vaginal discharge that is light pink or brown in color. ? A need to urinate more frequently than usual. ? Nausea.  Do not drive for 24 hours if you were given a sedative.  Do not have sex or insert anything into  your vagina until your health care provider approves. Summary  Endometrial ablation is done to treat the many causes of heavy menstrual bleeding.  The procedure may be done only after medications have been tried to control the bleeding.  Plan to have someone take you home from the hospital or clinic. This information is not intended to replace advice given to you by your health care provider. Make sure you discuss any questions you have with your health care provider. Document Released: 01/20/2004 Document Revised: 03/29/2016 Document Reviewed: 03/29/2016 Elsevier Interactive Patient Education  2017 [employer].

## 2017-06-24 ENCOUNTER — Other Ambulatory Visit: Payer: Self-pay | Admitting: Obstetrics and Gynecology

## 2017-06-25 ENCOUNTER — Encounter (HOSPITAL_BASED_OUTPATIENT_CLINIC_OR_DEPARTMENT_OTHER): Payer: Self-pay | Admitting: *Deleted

## 2017-06-25 SURGERY — HYSTEROSCOPY
Anesthesia: General

## 2017-06-26 ENCOUNTER — Encounter (HOSPITAL_BASED_OUTPATIENT_CLINIC_OR_DEPARTMENT_OTHER): Payer: Self-pay | Admitting: Emergency Medicine

## 2017-06-26 ENCOUNTER — Other Ambulatory Visit: Payer: Self-pay

## 2017-06-26 NOTE — Progress Notes (Signed)
SPOKE WITH: patient  RIDING HOME WITH: spouse Ethelene Brownsnthony [phone] AM MEDICATIONS: levothyroxine  NPO STATUS: after midnight, sip of water with meds LABS: needs urine preg DOS  COMMENTS/CONCERNS: advised no smoking x24 hours prior to surgery
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
## 2017-06-28 ENCOUNTER — Encounter (HOSPITAL_BASED_OUTPATIENT_CLINIC_OR_DEPARTMENT_OTHER): Payer: Self-pay | Admitting: Anesthesiology

## 2017-06-28 ENCOUNTER — Encounter (HOSPITAL_BASED_OUTPATIENT_CLINIC_OR_DEPARTMENT_OTHER)
Admission: RE | Disposition: A | Discharge status: Home / Self Care | Payer: Self-pay | Source: Ambulatory Visit | Attending: Obstetrics and Gynecology

## 2017-06-28 ENCOUNTER — Ambulatory Visit (HOSPITAL_BASED_OUTPATIENT_CLINIC_OR_DEPARTMENT_OTHER)
Admission: RE | Admit: 2017-06-28 | Discharge: 2017-06-28 | Disposition: A | Discharge status: Home / Self Care | Payer: Medicaid Other | Source: Ambulatory Visit | Attending: Obstetrics and Gynecology | Admitting: Obstetrics and Gynecology

## 2017-06-28 ENCOUNTER — Ambulatory Visit (HOSPITAL_BASED_OUTPATIENT_CLINIC_OR_DEPARTMENT_OTHER): Payer: Medicaid Other | Admitting: Anesthesiology

## 2017-06-28 DIAGNOSIS — Z91040 Latex allergy status: Secondary | ICD-10-CM | POA: Diagnosis not present

## 2017-06-28 DIAGNOSIS — F419 Anxiety disorder, unspecified: Secondary | ICD-10-CM | POA: Diagnosis not present

## 2017-06-28 DIAGNOSIS — F17210 Nicotine dependence, cigarettes, uncomplicated: Secondary | ICD-10-CM | POA: Insufficient documentation

## 2017-06-28 DIAGNOSIS — F329 Major depressive disorder, single episode, unspecified: Secondary | ICD-10-CM | POA: Insufficient documentation

## 2017-06-28 DIAGNOSIS — N920 Excessive and frequent menstruation with regular cycle: Secondary | ICD-10-CM | POA: Insufficient documentation

## 2017-06-28 DIAGNOSIS — E282 Polycystic ovarian syndrome: Secondary | ICD-10-CM | POA: Insufficient documentation

## 2017-06-28 DIAGNOSIS — Z79899 Other long term (current) drug therapy: Secondary | ICD-10-CM | POA: Diagnosis not present

## 2017-06-28 DIAGNOSIS — E039 Hypothyroidism, unspecified: Secondary | ICD-10-CM | POA: Insufficient documentation

## 2017-06-28 DIAGNOSIS — Z7984 Long term (current) use of oral hypoglycemic drugs: Secondary | ICD-10-CM | POA: Diagnosis not present

## 2017-06-28 HISTORY — DX: Excessive and frequent menstruation with regular cycle: N92.0

## 2017-06-28 HISTORY — DX: Personal history of urinary calculi: Z87.442

## 2017-06-28 HISTORY — PX: DILITATION & CURRETTAGE/HYSTROSCOPY WITH NOVASURE ABLATION: SHX5568

## 2017-06-28 SURGERY — DILATATION & CURETTAGE/HYSTEROSCOPY WITH NOVASURE ABLATION
Anesthesia: General | Site: Uterus

## 2017-06-28 MED ORDER — LACTATED RINGERS IV SOLN
INTRAVENOUS | Status: DC
  Administered 2017-06-28 (×2): via INTRAVENOUS
  Filled 2017-06-28: qty 1000

## 2017-06-28 MED ORDER — MIDAZOLAM HCL 2 MG/2ML IJ SOLN
INTRAMUSCULAR | Status: DC | PRN
  Administered 2017-06-28: 2 mg via INTRAVENOUS

## 2017-06-28 MED ORDER — HYDROMORPHONE HCL 1 MG/ML IJ SOLN
0.2500 mg | INTRAMUSCULAR | Status: DC | PRN
  Administered 2017-06-28: 0.25 mg via INTRAVENOUS
  Filled 2017-06-28: qty 0.5

## 2017-06-28 MED ORDER — BUPIVACAINE-EPINEPHRINE (PF) 0.5% -1:200000 IJ SOLN
INTRAMUSCULAR | Status: AC
Start: 2017-06-28 — End: ?
  Filled 2017-06-28: qty 30

## 2017-06-28 MED ORDER — KETOROLAC TROMETHAMINE 30 MG/ML IJ SOLN
INTRAMUSCULAR | Status: DC | PRN
  Administered 2017-06-28: 30 mg via INTRAMUSCULAR

## 2017-06-28 MED ORDER — PROMETHAZINE HCL 25 MG/ML IJ SOLN
6.2500 mg | INTRAMUSCULAR | Status: DC | PRN
  Filled 2017-06-28: qty 1

## 2017-06-28 MED ORDER — ACETAMINOPHEN 500 MG PO TABS
ORAL_TABLET | ORAL | Status: AC
  Filled 2017-06-28: qty 2

## 2017-06-28 MED ORDER — DEXAMETHASONE SODIUM PHOSPHATE 10 MG/ML IJ SOLN
INTRAMUSCULAR | Status: DC | PRN
  Administered 2017-06-28: 10 mg via INTRAVENOUS

## 2017-06-28 MED ORDER — CELECOXIB 200 MG PO CAPS
200.0000 mg | ORAL_CAPSULE | Freq: Once | ORAL | Status: AC
  Administered 2017-06-28: 200 mg via ORAL
  Filled 2017-06-28: qty 1

## 2017-06-28 MED ORDER — KETOROLAC TROMETHAMINE 30 MG/ML IJ SOLN
INTRAMUSCULAR | Status: AC
  Filled 2017-06-28: qty 1

## 2017-06-28 MED ORDER — FAMOTIDINE 20 MG PO TABS
ORAL_TABLET | ORAL | Status: AC
  Filled 2017-06-28: qty 1

## 2017-06-28 MED ORDER — HYDROCODONE-ACETAMINOPHEN 5-300 MG PO TABS: 1.0000 | ORAL_TABLET | ORAL | 0 refills | Status: AC | PRN

## 2017-06-28 MED ORDER — OXYCODONE HCL 5 MG PO TABS
5.0000 mg | ORAL_TABLET | Freq: Once | ORAL | Status: AC | PRN
  Administered 2017-06-28: 5 mg via ORAL
  Filled 2017-06-28: qty 1

## 2017-06-28 MED ORDER — CELECOXIB 200 MG PO CAPS
ORAL_CAPSULE | ORAL | Status: AC
  Filled 2017-06-28: qty 1

## 2017-06-28 MED ORDER — SCOPOLAMINE 1 MG/3DAYS TD PT72
MEDICATED_PATCH | TRANSDERMAL | Status: AC
  Filled 2017-06-28: qty 1

## 2017-06-28 MED ORDER — DEXAMETHASONE SODIUM PHOSPHATE 10 MG/ML IJ SOLN
INTRAMUSCULAR | Status: AC
  Filled 2017-06-28: qty 1

## 2017-06-28 MED ORDER — SODIUM CHLORIDE 0.9 % IR SOLN
Status: DC | PRN
  Administered 2017-06-28: 3000 mL

## 2017-06-28 MED ORDER — PROPOFOL 10 MG/ML IV BOLUS
INTRAVENOUS | Status: DC | PRN
  Administered 2017-06-28: 200 mg via INTRAVENOUS

## 2017-06-28 MED ORDER — OXYCODONE HCL 5 MG/5ML PO SOLN
5.0000 mg | Freq: Once | ORAL | Status: AC | PRN
  Filled 2017-06-28: qty 5

## 2017-06-28 MED ORDER — BUPIVACAINE-EPINEPHRINE 0.5% -1:200000 IJ SOLN
INTRAMUSCULAR | Status: DC | PRN
  Administered 2017-06-28: 10 mL

## 2017-06-28 MED ORDER — IBUPROFEN 800 MG PO TABS: 800.0000 mg | ORAL_TABLET | Freq: Three times a day (TID) | ORAL | 1 refills | Status: AC | PRN

## 2017-06-28 MED ORDER — LIDOCAINE 2% (20 MG/ML) 5 ML SYRINGE
INTRAMUSCULAR | Status: DC | PRN
  Administered 2017-06-28: 60 mg via INTRAVENOUS

## 2017-06-28 MED ORDER — FAMOTIDINE 20 MG PO TABS
20.0000 mg | ORAL_TABLET | Freq: Once | ORAL | Status: AC
Start: 2017-06-28 — End: 2017-06-28
  Administered 2017-06-28: 20 mg via ORAL
  Filled 2017-06-28: qty 1

## 2017-06-28 MED ORDER — ACETAMINOPHEN 500 MG PO TABS
1000.0000 mg | ORAL_TABLET | Freq: Once | ORAL | Status: AC
  Administered 2017-06-28: 1000 mg via ORAL
  Filled 2017-06-28: qty 2

## 2017-06-28 MED ORDER — HYDROMORPHONE HCL 1 MG/ML IJ SOLN
INTRAMUSCULAR | Status: AC
  Filled 2017-06-28: qty 1

## 2017-06-28 MED ORDER — FENTANYL CITRATE (PF) 100 MCG/2ML IJ SOLN
INTRAMUSCULAR | Status: DC | PRN
  Administered 2017-06-28: 25 ug via INTRAVENOUS
  Administered 2017-06-28: 75 ug via INTRAVENOUS

## 2017-06-28 MED ORDER — SCOPOLAMINE 1 MG/3DAYS TD PT72
1.0000 | MEDICATED_PATCH | TRANSDERMAL | Status: DC
  Administered 2017-06-28: 1 via TRANSDERMAL
  Filled 2017-06-28: qty 1

## 2017-06-28 MED ORDER — ONDANSETRON HCL 4 MG/2ML IJ SOLN
INTRAMUSCULAR | Status: AC
Start: 2017-06-28 — End: ?
  Filled 2017-06-28: qty 2

## 2017-06-28 MED ORDER — OXYCODONE HCL 5 MG PO TABS
ORAL_TABLET | ORAL | Status: AC
  Filled 2017-06-28: qty 1

## 2017-06-28 SURGICAL SUPPLY — 24 items
ABLATOR ENDOMETRIAL BIPOLAR (ABLATOR) ×2 IMPLANT
BIPOLAR CUTTING LOOP 21FR (ELECTRODE)
CANISTER SUCT 3000ML PPV (MISCELLANEOUS) ×2 IMPLANT
CATH ROBINSON RED A/P 16FR (CATHETERS) IMPLANT
DILATOR CANAL MILEX (MISCELLANEOUS) IMPLANT
GAUZE VASELINE 1X8 (GAUZE/BANDAGES/DRESSINGS) IMPLANT
GLOVE BIOGEL PI IND STRL 7.0 (GLOVE) ×1 IMPLANT
GLOVE BIOGEL PI IND STRL 7.5 (GLOVE) ×2 IMPLANT
GLOVE BIOGEL PI IND STRL 8.5 (GLOVE) IMPLANT
GLOVE BIOGEL PI INDICATOR 7.0 (GLOVE) ×1
GLOVE BIOGEL PI INDICATOR 7.5 (GLOVE) ×2
GLOVE BIOGEL PI INDICATOR 8.5 (GLOVE)
GLOVE ECLIPSE 8.0 STRL XLNG CF (GLOVE) ×4 IMPLANT
GOWN STRL REUS W/TWL XL LVL3 (GOWN DISPOSABLE) ×4 IMPLANT
IV LACTATED RINGER IRRG 3000ML (IV SOLUTION)
IV LR IRRIG 3000ML ARTHROMATIC (IV SOLUTION) IMPLANT
KIT TURNOVER CYSTO (KITS) ×2 IMPLANT
LOOP CUTTING BIPOLAR 21FR (ELECTRODE) IMPLANT
PACK VAGINAL MINOR WOMEN LF (CUSTOM PROCEDURE TRAY) ×2 IMPLANT
PAD OB MATERNITY 4.3X12.25 (PERSONAL CARE ITEMS) ×2 IMPLANT
TOWEL OR 17X24 6PK STRL BLUE (TOWEL DISPOSABLE) ×4 IMPLANT
TUBING AQUILEX INFLOW (TUBING) ×2 IMPLANT
TUBING AQUILEX OUTFLOW (TUBING) ×2 IMPLANT
WATER STERILE IRR 500ML POUR (IV SOLUTION) IMPLANT

## 2017-06-28 NOTE — Anesthesia Preprocedure Evaluation (Addendum)
Anesthesia Evaluation  Patient identified by MRN, date of birth, ID band Patient awake    Reviewed: Allergy & Precautions, NPO status , Patient's Chart, lab work & pertinent test results  Airway Mallampati: II  TM Distance: >3 FB Neck ROM: Full    Dental no notable dental hx. (+) Teeth Intact, Dental Advisory Given   Pulmonary Current Smoker,    Pulmonary exam normal breath sounds clear to auscultation       Cardiovascular negative cardio ROS Normal cardiovascular exam Rhythm:Regular Rate:Normal     Neuro/Psych  Headaches, Seizures -, Well Controlled,  PSYCHIATRIC DISORDERS Anxiety Depression Neuropathic pain    GI/Hepatic negative GI ROS, Neg liver ROS,   Endo/Other  Hypothyroidism PCOS (polycystic ovarian syndrome)  Renal/GU negative Renal ROS     Musculoskeletal negative musculoskeletal ROS (+)   Abdominal (+) + obese,   Peds  Hematology negative hematology ROS (+)   Anesthesia Other Findings Menorrhagia  Reproductive/Obstetrics                           Anesthesia Physical Anesthesia Plan  ASA: II  Anesthesia Plan: General   Post-op Pain Management:    Induction: Intravenous  PONV Risk Score and Plan: 3 and Midazolam, Dexamethasone, Ondansetron and Treatment may vary due to age or medical condition  Airway Management Planned: LMA  Additional Equipment:   Intra-op Plan:   Post-operative Plan: Extubation in OR  Informed Consent: I have reviewed the patients History and Physical, chart, labs and discussed the procedure including the risks, benefits and alternatives for the proposed anesthesia with the patient or authorized representative who has indicated his/her understanding and acceptance.   Dental advisory given  Plan Discussed with: CRNA  Anesthesia Plan Comments:         Anesthesia Quick Evaluation

## 2017-06-28 NOTE — Anesthesia Postprocedure Evaluation (Signed)
Anesthesia Post Note  Patient: Denise Griffin  Procedure(s) Performed: DILATATION & CURETTAGE/HYSTEROSCOPY WITH NOVASURE ABLATION (N/A Uterus)     Patient location during evaluation: PACU Anesthesia Type: General Level of consciousness: awake and alert Pain management: pain level controlled Vital Signs Assessment: post-procedure vital signs reviewed and stable Respiratory status: spontaneous breathing, nonlabored ventilation, respiratory function stable and patient connected to nasal cannula oxygen Cardiovascular status: blood pressure returned to baseline and stable Postop Assessment: no apparent nausea or vomiting Anesthetic complications: no    Last Vitals:  Vitals:   06/28/17 1130 06/28/17 1223  BP: 106/73 102/60  Pulse: 71 60  Resp: 15 16  Temp:  36.5 C  SpO2: 99% 100%    Last Pain:  Vitals:   06/28/17 1223  TempSrc:   PainSc: 8                  Alijah Hyde P Dewanda Fennema

## 2017-06-28 NOTE — Op Note (Signed)
[facility] Operative Procedure   Hysteroscopy, dilation and curettage, NOVASURE ABLATION OF THE ENDOMETRIUM   Denise Griffin  DOB:    October 08, 1985  MRN:    [id number]  CSN:    [id number]  Date of Surgery:  06/28/2017  Preoperative Diagnosis:  Menorrhagia  History of anemia  Postoperative Diagnosis:  Menorrhagia  History of anemia  Procedure:  Hysteroscopy   Dilation and curettage   NovaSure Ablation of the Endometrium  Surgeon:  Leonard Schwartz, M.D.  Assistant:  none  Anesthetic:  General  Disposition:  Denise Griffin is a 32 y.o. female with the above mentioned diagnosis. She understands the indications for her surgical procedure as well as the alternative treatment options. She understands that she should not become pregnant.  Her contraceptive method is: Tubal sterilization.  She understands the potential benefits and potential risks associated with NovaSure ablation.  Her questions were answered.  She accepts those risks.  A permit has been signed.  Findings:  The uterus is approximately upper limits of normal size.  No adnexal masses are appreciated.  The cervical length is 4.0 centimeters. The uterus sounded to 8.5 centimeters. The cavity length is 4.5 centimeters. The cavity width is 4.5 centimeters.  The urine pregnancy test is negative.  Procedure:  The patient was identified as the proper candidate, and her medical records were reviewed. She was given an injection of Toradol 30 mg IM. Her vital signs were stable.  The patient was placed in a lithotomy position.  A pelvic exam and speculum exam were performed. The cervix was prepped with multiple layers of Betadine.  The bladder was drained of urine.  The patient was sterilely draped.  A single-tooth tenaculum was placed on the cervix. A paracervical block was placed using 10 cc of half percent Marcaine with epinephrine 1-200,000.  The patient was felt to be  adequately prepared for surgery. The cervix was sounded.  The uterus was sounded. The cervix was gently dilated.  The diagnostic hysteroscope was inserted.  The cavity was inspected.  No abnormalities were noted.  The hysteroscope was removed.  The cervix was dilated further.  The cavity was then curetted until it was felt to be clean.  Hemostasis was adequate.  The NovaSure instrument was tested and was felt to be in proper operating condition.  The cavity length was set.  The NovaSure instrument was introduced into the uterus.  The cavity width was determined.  A test procedure was performed and the instrument passed.  The cavity was then ablated for 95 seconds.  The power was 111 W.  The patient tolerated her procedure well.  All instrument were removed.  The uterus was reexamined and was felt to be firm.  The patient was returned to the supine position.  Sponge, and needle counts were correct.  The estimated blood loss was 5 cc.  The estimated fluid deficit was 120 cc.  The patient's anesthetic was reversed and the patient was taken to the recovery room in stable condition.  Followup instructions:  Instructions were reviewed.  The patient will call for pain or fever or any problem that she is concerned about.  She was told to expect a vaginal discharge for 10 days to 3 weeks.  She was asked to refrain from intercourse until all of the discharge has resolved.  Followup visit:  2 weeks  Discharge medications:  Motrin 800 mg every 8 hours as needed for pain. Vicodin 1 or 2 tablets  every 4 hours as needed for pain.  Leonard SchwartzArthur Vernon Cori Henningsen, M.D.  June 28, 2017 10:29 AM

## 2017-06-28 NOTE — Discharge Instructions (Signed)
°Post Anesthesia Home Care Instructions ° °Activity: °Get plenty of rest for the remainder of the day. A responsible individual must stay with you for 24 hours following the procedure.  °For the next 24 hours, DO NOT: °-Drive a car °-Operate machinery °-Drink alcoholic beverages °-Take any medication unless instructed by your physician °-Make any legal decisions or sign important papers. ° °Meals: °Start with liquid foods such as gelatin or soup. Progress to regular foods as tolerated. Avoid greasy, spicy, heavy foods. If nausea and/or vomiting occur, drink only clear liquids until the nausea and/or vomiting subsides. Call your physician if vomiting continues. ° °Special Instructions/Symptoms: °Your throat may feel dry or sore from the anesthesia or the breathing tube placed in your throat during surgery. If this causes discomfort, gargle with warm salt water. The discomfort should disappear within 24 hours. ° °If you had a scopolamine patch placed behind your ear for the management of post- operative nausea and/or vomiting: ° °1. The medication in the patch is effective for 72 hours, after which it should be removed.  Wrap patch in a tissue and discard in the trash. Wash hands thoroughly with soap and water. °2. You may remove the patch earlier than 72 hours if you experience unpleasant side effects which may include dry mouth, dizziness or visual disturbances. °3. Avoid touching the patch. Wash your hands with soap and water after contact with the patch. °  ° °Endometrial Ablation °Endometrial ablation is a procedure that destroys the thin inner layer of the lining of the uterus (endometrium). This procedure may be done: °· To stop heavy periods. °· To stop bleeding that is causing anemia. °· To control irregular bleeding. °· To treat bleeding caused by small tumors (fibroids) in the endometrium. ° °This procedure is often an alternative to major surgery, such as removal of the uterus and cervix (hysterectomy).  As a result of this procedure: °· You may not be able to have children. However, if you are premenopausal (you have not gone through menopause): °? You may still have a small chance of getting pregnant. °? You will need to use a reliable method of birth control after the procedure to prevent pregnancy. °· You may stop having a menstrual period, or you may have only a small amount of bleeding during your period. Menstruation may return several years after the procedure. ° °Tell a health care provider about: °· Any allergies you have. °· All medicines you are taking, including vitamins, herbs, eye drops, creams, and over-the-counter medicines. °· Any problems you or family members have had with the use of anesthetic medicines. °· Any blood disorders you have. °· Any surgeries you have had. °· Any medical conditions you have. °What are the risks? °Generally, this is a safe procedure. However, problems may occur, including: °· A hole (perforation) in the uterus or bowel. °· Infection of the uterus, bladder, or vagina. °· Bleeding. °· Damage to other structures or organs. °· An air bubble in the lung (air embolus). °· Problems with pregnancy after the procedure. °· Failure of the procedure. °· Decreased ability to diagnose cancer in the endometrium. ° °What happens before the procedure? °· You will have tests of your endometrium to make sure there are no pre-cancerous cells or cancer cells present. °· You may have an ultrasound of the uterus. °· You may be given medicines to thin the endometrium. °· Ask your health care provider about: °? Changing or stopping your regular medicines. This is especially important if you   take diabetes medicines or blood thinners. °? Taking medicines such as aspirin and ibuprofen. These medicines can thin your blood. Do not take these medicines before your procedure if your doctor tells you not to. °· Plan to have someone take you home from the hospital or clinic. °What happens during the  procedure? °· You will lie on an exam table with your feet and legs supported as in a pelvic exam. °· To lower your risk of infection: °? Your health care team will wash or sanitize their hands and put on germ-free (sterile) gloves. °? Your genital area will be washed with soap. °· An IV tube will be inserted into one of your veins. °· You will be given a medicine to help you relax (sedative). °· A surgical instrument with a light and camera (resectoscope) will be inserted into your vagina and moved into your uterus. This allows your surgeon to see inside your uterus. °· Endometrial tissue will be removed using one of the following methods: °? Radiofrequency. This method uses a radiofrequency-alternating electric current to remove the endometrium. °? Cryotherapy. This method uses extreme cold to freeze the endometrium. °? Heated-free liquid. This method uses a heated saltwater (saline) solution to remove the endometrium. °? Microwave. This method uses high-energy microwaves to heat up the endometrium and remove it. °? Thermal balloon. This method involves inserting a catheter with a balloon tip into the uterus. The balloon tip is filled with heated fluid to remove the endometrium. °The procedure may vary among health care providers and hospitals. °What happens after the procedure? °· Your blood pressure, heart rate, breathing rate, and blood oxygen level will be monitored until the medicines you were given have worn off. °· As tissue healing occurs, you may notice vaginal bleeding for 4-6 weeks after the procedure. You may also experience: °? Cramps. °? Thin, watery vaginal discharge that is light pink or brown in color. °? A need to urinate more frequently than usual. °? Nausea. °· Do not drive for 24 hours if you were given a sedative. °· Do not have sex or insert anything into your vagina until your health care provider approves. °Summary °· Endometrial ablation is done to treat the many causes of heavy menstrual  bleeding. °· The procedure may be done only after medications have been tried to control the bleeding. °· Plan to have someone take you home from the hospital or clinic. °This information is not intended to replace advice given to you by your health care provider. Make sure you discuss any questions you have with your health care provider. °Document Released: 01/20/2004 Document Revised: 03/29/2016 Document Reviewed: 03/29/2016 °Elsevier Interactive Patient Education © 2017 [employer] ° °

## 2017-06-28 NOTE — Progress Notes (Signed)
The patient was interviewed and examined today.  The previously documented history and physical examination was reviewed. There are no changes. The operative procedure was reviewed. The risks and benefits were outlined again. The specific risks include, but are not limited to, anesthetic complications, bleeding, infections, and possible damage to the surrounding organs. The patient's questions were answered.  We are ready to proceed as outlined. The likelihood of the patient achieving the goals of this procedure is very likely.   BP 113/60   Pulse 78   Temp 98.5 F (36.9 C) (Oral)   Resp 18   Ht 5\' 5"  (1.651 m)   Wt 98.9 kg (218 lb 1.6 oz)   LMP 05/12/2017   SpO2 100%   BMI 36.29 kg/m   CBC    Component Value Date/Time   WBC 5.7 05/12/2017 1711   RBC 4.20 05/12/2017 1711   HGB 12.0 05/12/2017 2039   HCT 35.4 (L) 05/12/2017 2039   PLT 233 05/12/2017 1711   MCV 92.6 05/12/2017 1711   MCH 31.2 05/12/2017 1711   MCHC 33.7 05/12/2017 1711   RDW 12.5 05/12/2017 1711   LYMPHSABS 2.3 05/12/2017 1711   MONOABS 0.5 05/12/2017 1711   EOSABS 0.2 05/12/2017 1711   BASOSABS 0.0 05/12/2017 1711     Leonard SchwartzArthur Vernon Congetta Odriscoll, M.D. 06/28/2017

## 2017-06-28 NOTE — Transfer of Care (Signed)
Immediate Anesthesia Transfer of Care Note  Patient: Denise Griffin  Procedure(s) Performed: DILATATION & CURETTAGE/HYSTEROSCOPY WITH NOVASURE ABLATION (N/A Uterus)  Patient Location: PACU  Anesthesia Type:General  Level of Consciousness: awake, alert , oriented and patient cooperative  Airway & Oxygen Therapy: Patient Spontanous Breathing and Patient connected to nasal cannula oxygen  Post-op Assessment: Report given to RN and Post -op Vital signs reviewed and stable  Post vital signs: Reviewed and stable  Last Vitals:  Vitals Value Taken Time  BP    Temp    Pulse 100 06/28/2017 10:30 AM  Resp    SpO2 94 % 06/28/2017 10:30 AM  Vitals shown include unvalidated device data.  Last Pain:  Vitals:   06/28/17 0839  TempSrc:   PainSc: 7          Complications: No apparent anesthesia complications

## 2017-06-28 NOTE — Anesthesia Procedure Notes (Signed)
Procedure Name: LMA Insertion Date/Time: 06/28/2017 9:46 AM Performed by: Tyrone NineSauve, Fallynn Gravett F, CRNA Pre-anesthesia Checklist: Patient identified, Timeout performed, Emergency Drugs available, Suction available and Patient being monitored Patient Re-evaluated:Patient Re-evaluated prior to induction Oxygen Delivery Method: Circle system utilized Preoxygenation: Pre-oxygenation with 100% oxygen Induction Type: IV induction Ventilation: Mask ventilation without difficulty LMA: LMA inserted LMA Size: 4.0 Number of attempts: 1 Placement Confirmation: breath sounds checked- equal and bilateral,  CO2 detector and positive ETCO2 Tube secured with: Tape Dental Injury: Teeth and Oropharynx as per pre-operative assessment

## 2017-07-01 ENCOUNTER — Encounter (HOSPITAL_BASED_OUTPATIENT_CLINIC_OR_DEPARTMENT_OTHER): Payer: Self-pay | Admitting: Obstetrics and Gynecology

## 2017-08-20 ENCOUNTER — Ambulatory Visit (HOSPITAL_BASED_OUTPATIENT_CLINIC_OR_DEPARTMENT_OTHER): Payer: Medicaid Other
